# Patient Record
Sex: Male | Born: 1970 | Race: White | Hispanic: No | Marital: Married | Smoking: Never smoker
Health system: Southern US, Community
[De-identification: ages and names within clinical notes are randomized; demographics above are authoritative.]

## PROBLEM LIST (undated history)

## (undated) DIAGNOSIS — E039 Hypothyroidism, unspecified: Secondary | ICD-10-CM

## (undated) DIAGNOSIS — Z87442 Personal history of urinary calculi: Secondary | ICD-10-CM

## (undated) DIAGNOSIS — N200 Calculus of kidney: Secondary | ICD-10-CM

## (undated) DIAGNOSIS — E786 Lipoprotein deficiency: Secondary | ICD-10-CM

## (undated) DIAGNOSIS — E079 Disorder of thyroid, unspecified: Secondary | ICD-10-CM

## (undated) DIAGNOSIS — B354 Tinea corporis: Secondary | ICD-10-CM

## (undated) DIAGNOSIS — E119 Type 2 diabetes mellitus without complications: Secondary | ICD-10-CM

## (undated) DIAGNOSIS — Z8619 Personal history of other infectious and parasitic diseases: Secondary | ICD-10-CM

## (undated) HISTORY — DX: Tinea corporis: B35.4

## (undated) HISTORY — DX: Type 2 diabetes mellitus without complications: E11.9

## (undated) HISTORY — DX: Hypothyroidism, unspecified: E03.9

## (undated) HISTORY — DX: Disorder of thyroid, unspecified: E07.9

## (undated) HISTORY — DX: Lipoprotein deficiency: E78.6

---

## 2003-02-20 HISTORY — PX: INGUINAL HERNIA REPAIR: SUR1180

## 2004-01-05 ENCOUNTER — Ambulatory Visit: Payer: Self-pay | Admitting: General Surgery

## 2013-08-25 DIAGNOSIS — E039 Hypothyroidism, unspecified: Secondary | ICD-10-CM | POA: Insufficient documentation

## 2013-08-25 DIAGNOSIS — E119 Type 2 diabetes mellitus without complications: Secondary | ICD-10-CM | POA: Insufficient documentation

## 2013-09-08 DIAGNOSIS — Z794 Long term (current) use of insulin: Secondary | ICD-10-CM | POA: Insufficient documentation

## 2013-09-10 DIAGNOSIS — E109 Type 1 diabetes mellitus without complications: Secondary | ICD-10-CM | POA: Insufficient documentation

## 2014-04-20 LAB — HM DIABETES EYE EXAM

## 2016-08-27 LAB — LIPID PANEL
Cholesterol: 163 (ref 0–200)
HDL: 61 (ref 35–70)
LDL Cholesterol: 88
Triglycerides: 68 (ref 40–160)

## 2016-08-27 LAB — HEMOGLOBIN A1C: Hemoglobin A1C: 5.9

## 2016-08-27 LAB — BASIC METABOLIC PANEL: Creatinine: 0.7 (ref 0.6–1.3)

## 2016-12-28 LAB — TSH: TSH: 2.11 (ref 0.41–5.90)

## 2017-04-22 LAB — TSH: TSH: 4.24 (ref 0.41–5.90)

## 2017-04-22 LAB — HEMOGLOBIN A1C: Hemoglobin A1C: 6.6

## 2017-09-16 LAB — PSA: PSA: 0.9

## 2017-09-16 LAB — LIPID PANEL
Cholesterol: 154 (ref 0–200)
HDL: 55 (ref 35–70)
LDL Cholesterol: 88
Triglycerides: 56 (ref 40–160)

## 2017-09-16 LAB — HEMOGLOBIN A1C: Hemoglobin A1C: 6.1

## 2017-10-15 ENCOUNTER — Encounter: Payer: Self-pay | Admitting: Dietician

## 2017-10-15 ENCOUNTER — Encounter: Payer: BLUE CROSS/BLUE SHIELD | Attending: Family Medicine | Admitting: Dietician

## 2017-10-15 VITALS — BP 124/86 | Ht 74.0 in | Wt 174.2 lb

## 2017-10-15 DIAGNOSIS — Z713 Dietary counseling and surveillance: Secondary | ICD-10-CM | POA: Diagnosis not present

## 2017-10-15 DIAGNOSIS — E119 Type 2 diabetes mellitus without complications: Secondary | ICD-10-CM | POA: Insufficient documentation

## 2017-10-15 DIAGNOSIS — E109 Type 1 diabetes mellitus without complications: Secondary | ICD-10-CM

## 2017-10-15 NOTE — Progress Notes (Signed)
Diabetes Self-Management Education  Visit Type: First/Initial  Appt. Start Time: 1325 Appt. End Time: 1430  10/15/2017  Mr. Paul BeachBrian Jacobs, identified by name and date of birth, is a 47 y.o. male with a diagnosis of Diabetes: Type 1.   ASSESSMENT  Blood pressure 124/86, height 6\' 2"  (1.88 m), weight 174 lb 3.2 oz (79 kg). Body mass index is 22.37 kg/m.  Diabetes Self-Management Education - 10/15/17 1510      Visit Information   Visit Type  First/Initial      Initial Visit   Diabetes Type  Type 1      Health Coping   How would you rate your overall health?  Good      Psychosocial Assessment   Patient Belief/Attitude about Diabetes  Motivated to manage diabetes    Self-care barriers  None    Self-management support  Family;Doctor's office    Other persons present  Patient    Patient Concerns  Healthy Lifestyle;Glycemic Control   prevent complications, become more fit   Special Needs  None    Preferred Learning Style  Auditory;Visual;Hands on    Learning Readiness  Ready    What is the last grade level you completed in school?  1 hear college      Pre-Education Assessment   Patient understands the diabetes disease and treatment process.  Demonstrates understanding / competency    Patient understands incorporating nutritional management into lifestyle.  Demonstrates understanding / competency    Patient undertands incorporating physical activity into lifestyle.  Demonstrates understanding / competency    Patient understands using medications safely.  Demonstrates understanding / competency    Patient understands monitoring blood glucose, interpreting and using results  Demonstrates understanding / competency   uses Libre & Contour Next EZ meter; pt is frustrated with difference in [place] and meter BG results    Patient understands prevention, detection, and treatment of acute complications.  Demonstrates understanding / competency    Patient understands prevention, detection,  and treatment of chronic complications.  Demonstrates understanding / competency    Patient understands how to develop strategies to address psychosocial issues.  Demonstrates understanding / competency    Patient understands how to develop strategies to promote health/change behavior.  Demonstrates understanding / competency      Complications   Last HgB A1C per patient/outside source  6.1 %    How often do you check your blood sugar?  > 4 times/day    Fasting Blood glucose range (mg/dL)  16-109;[id number];130-179    Postprandial Blood glucose range (mg/dL)  981-191;[id number];70-129    Have you had a dilated eye exam in the past 12 months?  Yes    Have you had a dental exam in the past 12 months?  Yes    Are you checking your feet?  Yes   has toenail fungus right big toe   How many days per week are you checking your feet?  7      Dietary Intake   Breakfast  eats 45-60 gram carbs for breakfast at 8a-eats out 2-3x/wk   Snack (morning)  none    Lunch  eats 30-45 grams carbs for lunch at 12p-eats fried foods daily, eats snack foods and sweets 2-3x/wk-eats out 1x/wk   Snack (afternoon)  none    Dinner  eats supper at 7:30p-usually eats meat and vegetables-eats out 1-2x/wk   Snack (evening)  none    Beverage(s)  drinks water 2-3x/day, unsweetened tea 4-5x/day      Exercise  Exercise Type  Light (walking / raking leaves)    How many days per week to you exercise?  3.5    How many minutes per day do you exercise?  35    Total minutes per week of exercise  122.5      Patient Education   Previous Diabetes Education  Yes (please comment)    Nutrition management   Role of diet in the treatment of diabetes and the relationship between the three main macronutrients and blood glucose level    Physical activity and exercise   Role of exercise on diabetes management, blood pressure control and cardiac health.    Medications  Taught/reviewed insulin injection, site rotation, insulin storage and needle  disposal.;Reviewed patients medication for diabetes, action, purpose, timing of dose and side effects.    Monitoring  Purpose and frequency of SMBG.;Yearly dilated eye exam;Identified appropriate SMBG and/or A1C goals.;Taught/discussed recording of test results and interpretation of SMBG.   discussed difference in how BG meter and Libre sensor measure sugar    Acute complications  Taught treatment of hypoglycemia - the 15 rule.;Trained/discussed glucagon administration to patient and designated other.    Chronic complications  Relationship between chronic complications and blood glucose control;Retinopathy and reason for yearly dilated eye exams;Dental care;Nephropathy, what it is, prevention of, the use of ACE, ARB's and early detection of through urine microalbumia.;Lipid levels, blood glucose control and heart disease;Reviewed with patient heart disease, higher risk of, and prevention    Psychosocial adjustment  Role of stress on diabetes      Post-Education Assessment   Patient understands the diabetes disease and treatment process.  Demonstrates understanding / competency    Patient understands incorporating nutritional management into lifestyle.  Demonstrates understanding / competency    Patient undertands incorporating physical activity into lifestyle.  Demonstrates understanding / competency    Patient understands using medications safely.  Demonstrates understanding / competency    Patient understands monitoring blood glucose, interpreting and using results  Demonstrates understanding / competency    Patient understands prevention, detection, and treatment of acute complications.  Demonstrates understanding / competency    Patient understands prevention, detection, and treatment of chronic complications.  Demonstrates understanding / competency    Patient understands how to develop strategies to address psychosocial issues.  Demonstrates understanding / competency    Patient understands how  to develop strategies to promote health/change behavior.  Demonstrates understanding / competency      Outcomes   Expected Outcomes  Demonstrated interest in learning. Expect positive outcomes    Program Status  Completed       Individualized Plan for Diabetes Self-Management Training:   Learning Objective:  Patient will have a greater understanding of diabetes self-management. Patient education plan is to attend individual and/or group sessions per assessed needs and concerns.    Patient Instructions   Continue to check blood sugars at least  before  meals and before bed daily and PRN Exercise: continue walking 30-45 min. 4-5x/wk.  Carry fast acting glucose and a snack at all times Rotate injection sites Drink plenty of water Limit intake of fried foods and sweets Call if any questions or problems arise  Expected Outcomes:  Demonstrated interest in learning. Expect positive outcomes  Education material provided: A1C handout  If problems or questions, patient to contact team via:  [phone]  Future DSME appointment:   call if needed

## 2017-10-15 NOTE — Patient Instructions (Addendum)
  Continue to check blood sugars at least  before  meals and before bed daily and PRN Exercise: continue walking 30-45 min. 4-5x/wk.  Carry fast acting glucose and a snack at all times Rotate injection sites Drink plentyy of water  Limit intake of sweets and fried foods Call if any questions or problems arise

## 2017-11-01 ENCOUNTER — Ambulatory Visit: Payer: BLUE CROSS/BLUE SHIELD | Attending: Otolaryngology

## 2017-11-01 DIAGNOSIS — R0683 Snoring: Secondary | ICD-10-CM | POA: Diagnosis not present

## 2017-11-01 DIAGNOSIS — E119 Type 2 diabetes mellitus without complications: Secondary | ICD-10-CM | POA: Diagnosis not present

## 2017-11-01 DIAGNOSIS — G4733 Obstructive sleep apnea (adult) (pediatric): Secondary | ICD-10-CM | POA: Diagnosis present

## 2017-12-31 ENCOUNTER — Other Ambulatory Visit: Payer: Self-pay | Admitting: Physician Assistant

## 2017-12-31 DIAGNOSIS — M5116 Intervertebral disc disorders with radiculopathy, lumbar region: Secondary | ICD-10-CM

## 2018-04-28 ENCOUNTER — Other Ambulatory Visit: Payer: Self-pay

## 2018-04-28 LAB — BASIC METABOLIC PANEL: Creatinine: 0.9 (ref 0.6–1.3)

## 2018-04-28 LAB — HEMOGLOBIN A1C: Hemoglobin A1C: 6.7

## 2018-04-29 LAB — CREATININE, SERUM
Creatinine, Ser: 0.88 mg/dL (ref 0.76–1.27)
GFR calc Af Amer: 117 mL/min/{1.73_m2} (ref >59)
GFR calc non Af Amer: 101 mL/min/{1.73_m2} (ref >59)

## 2018-04-29 LAB — HGB A1C W/O EAG: HEMOGLOBIN A1C: 6.7 % — AB (ref 4.8–5.6)

## 2018-08-28 ENCOUNTER — Other Ambulatory Visit: Payer: Self-pay

## 2018-08-28 ENCOUNTER — Ambulatory Visit: Payer: 59

## 2018-08-28 DIAGNOSIS — Z0189 Encounter for other specified special examinations: Secondary | ICD-10-CM

## 2018-08-28 LAB — POCT URINALYSIS DIPSTICK
Bilirubin, UA: NEGATIVE
Glucose, UA: NEGATIVE
Ketones, UA: NEGATIVE
Leukocytes, UA: NEGATIVE
Nitrite, UA: NEGATIVE
Protein, UA: NEGATIVE
Spec Grav, UA: 1.01 (ref 1.010–1.025)
Urobilinogen, UA: 0.2 E.U./dL
pH, UA: 6 (ref 5.0–8.0)

## 2018-08-29 LAB — CMP12+LP+TP+TSH+6AC+PSA+CBC…
ALT: 19 IU/L (ref 0–44)
AST: 17 IU/L (ref 0–40)
Albumin/Globulin Ratio: 2.3 — ABNORMAL HIGH (ref 1.2–2.2)
Albumin: 4.4 g/dL (ref 4.0–5.0)
BUN/Creatinine Ratio: 27 — ABNORMAL HIGH (ref 9–20)
BUN: 22 mg/dL (ref 6–24)
Basophils Absolute: 0 10*3/uL (ref 0.0–0.2)
Basos: 1 %
Bilirubin Total: 0.4 mg/dL (ref 0.0–1.2)
Calcium: 9.6 mg/dL (ref 8.7–10.2)
Chloride: 104 mmol/L (ref 96–106)
Chol/HDL Ratio: 2.4 ratio (ref 0.0–5.0)
Cholesterol, Total: 175 mg/dL (ref 100–199)
Creatinine, Ser: 0.83 mg/dL (ref 0.76–1.27)
EOS (ABSOLUTE): 0.2 10*3/uL (ref 0.0–0.4)
Eos: 3 %
Estimated CHD Risk: <0.5 times avg. (ref 0.0–1.0)
Free Thyroxine Index: 2 (ref 1.2–4.9)
GFR calc Af Amer: 119 mL/min/{1.73_m2} (ref >59)
GFR calc non Af Amer: 103 mL/min/{1.73_m2} (ref >59)
GGT: 7 IU/L (ref 0–65)
Globulin, Total: 1.9 g/dL (ref 1.5–4.5)
Glucose: 153 mg/dL — ABNORMAL HIGH (ref 65–99)
HDL: 74 mg/dL (ref >39)
Hematocrit: 42.8 % (ref 37.5–51.0)
Hemoglobin: 15 g/dL (ref 13.0–17.7)
Immature Granulocytes: 0 %
Iron: 94 ug/dL (ref 38–169)
LDH: 137 IU/L (ref 121–224)
LDL Calculated: 90 mg/dL (ref 0–99)
Lymphocytes Absolute: 1.5 10*3/uL (ref 0.7–3.1)
Lymphs: 29 %
MCH: 30.2 pg (ref 26.6–33.0)
MCHC: 35 g/dL (ref 31.5–35.7)
MCV: 86 fL (ref 79–97)
Monocytes Absolute: 0.4 10*3/uL (ref 0.1–0.9)
Monocytes: 8 %
Neutrophils Absolute: 3.1 10*3/uL (ref 1.4–7.0)
Neutrophils: 59 %
Phosphorus: 3.5 mg/dL (ref 2.8–4.1)
Platelets: 296 10*3/uL (ref 150–450)
Potassium: 4.7 mmol/L (ref 3.5–5.2)
Prostate Specific Ag, Serum: 0.6 ng/mL (ref 0.0–4.0)
RBC: 4.97 x10E6/uL (ref 4.14–5.80)
RDW: 12.4 % (ref 11.6–15.4)
Sodium: 139 mmol/L (ref 134–144)
T3 Uptake Ratio: 23 % — ABNORMAL LOW (ref 24–39)
T4, Total: 8.7 ug/dL (ref 4.5–12.0)
TSH: 4.1 u[IU]/mL (ref 0.450–4.500)
Total Protein: 6.3 g/dL (ref 6.0–8.5)
Triglycerides: 55 mg/dL (ref 0–149)
Uric Acid: 4.2 mg/dL (ref 3.7–8.6)
VLDL Cholesterol Cal: 11 mg/dL (ref 5–40)
WBC: 5.2 10*3/uL (ref 3.4–10.8)

## 2018-08-29 LAB — CMP12+LP+TP+TSH+6AC+PSA+CBC?
Alkaline Phosphatase: 48 IU/L (ref 39–117)
Immature Grans (Abs): 0 10*3/uL (ref 0.0–0.1)

## 2018-08-29 LAB — HGB A1C W/O EAG: Hgb A1c MFr Bld: 6.2 % — ABNORMAL HIGH (ref 4.8–5.6)

## 2018-08-29 LAB — MICROALBUMIN / CREATININE URINE RATIO
Creatinine, Urine: 32.2 mg/dL
Microalb/Creat Ratio: <9 mg/g creat (ref 0–29)
Microalbumin, Urine: <3 ug/mL

## 2018-09-01 LAB — MICROALBUMIN / CREATININE URINE RATIO
Albumin ELP, Urine: 3.6
Albumin ELP, Urine: <3
Albumin Ratio: 2.2
Albumin Ratio: <2.5
Creatinine, Ur: 166.4 mg/dL
Creatinine, Ur: 121.9 mg/dL

## 2018-09-01 LAB — VITAMIN B12 DEFICIENCY PANEL
Folate: 10.4
VITAMIN B12: 645

## 2018-09-01 LAB — HEPATITIS B SURFACE ANTIBODY,QUALITATIVE: Hep B S Ab: >150

## 2018-09-01 LAB — TESTOSTERONE
Testosterone: 824
Vitamin D, 25-Hydroxy: 57.4

## 2018-09-02 ENCOUNTER — Encounter: Payer: Self-pay | Admitting: Internal Medicine

## 2018-09-02 ENCOUNTER — Ambulatory Visit: Payer: 59 | Admitting: Internal Medicine

## 2018-09-02 ENCOUNTER — Other Ambulatory Visit: Payer: Self-pay

## 2018-09-02 VITALS — BP 121/81 | HR 64 | Temp 96.7°F | Resp 14 | Ht 74.0 in | Wt 177.0 lb

## 2018-09-02 DIAGNOSIS — E7849 Other hyperlipidemia: Secondary | ICD-10-CM

## 2018-09-02 DIAGNOSIS — E119 Type 2 diabetes mellitus without complications: Secondary | ICD-10-CM

## 2018-09-02 DIAGNOSIS — B351 Tinea unguium: Secondary | ICD-10-CM | POA: Insufficient documentation

## 2018-09-02 DIAGNOSIS — E038 Other specified hypothyroidism: Secondary | ICD-10-CM

## 2018-09-02 NOTE — Progress Notes (Signed)
S  - 48 y.o. male IDDM who presents for annual physical evaluation  Has been feeling well, notes weather has been hot Takes 3-4 U at breakfast, 3-5 at lunch, 6-7 at dinner of Novalog and 14U Lantus at bedtime. Also on Metformin still. Has a freestyle Libre and checks sugars regularly, occas in 50's/60's, not often, and rare above 180.  No specific complaints, denies any recent CP, palpitations, SOB, abdominal pains, change in bowel habits, dark/black stools, vision changes (saw eye MD a month ago), no recent fevers or concrning Covid sx's, up rarely once a night to urinate, no urgency, toenail fungus persists on right, and now some on left, was on oral med in past for this and never went away completely. No fatigue.   Exercise - no regular exercise, tries to walk often but less recent past Diet - notes tries to watch and eat healthy,   Meds reviewed - also takes a baby ASA/day Current Outpatient Medications on File Prior to Visit  Medication Sig Dispense Refill  . glucagon 1 MG injection Inject 1 mg into the muscle as needed.    Marland Kitchen. glucose blood test strip TEST 4 TIMES DAILY    . insulin aspart (NOVOLOG) 100 UNIT/ML FlexPen Inject 5-7 Units into the skin 3 (three) times daily before meals. Uses ICR 1:15 + sliding scale if >180    . Insulin Glargine (LANTUS SOLOSTAR) 100 UNIT/ML Solostar Pen Inject 14 Units into the skin at bedtime.    Marland Kitchen. levothyroxine (SYNTHROID, LEVOTHROID) 50 MCG tablet Take 1 tablet by mouth daily.    . meloxicam (MOBIC) 15 MG tablet Take 1 tablet by mouth daily as needed.  0  . metFORMIN (GLUCOPHAGE) 1000 MG tablet Take 1 tablet by mouth 2 (two) times daily.    . Omega-3 Fatty Acids (FISH OIL PO) Take 1 capsule by mouth daily.     No current facility-administered medications on file prior to visit.     No Known Allergies   Social History   Tobacco Use  Smoking Status Never Smoker  Smokeless Tobacco Current User  . Types: Chew    Uses a can a day  FH - F with  DM  O - NAD, masked  BP 121/81 (BP Location: Right Arm, Patient Position: Sitting, Cuff Size: Large)   Pulse 64   Temp (!) 96.7 F (35.9 C) (Oral)   Resp 14   Ht 6\' 2"  (1.88 m)   Wt 177 lb (80.3 kg)   SpO2 97%   BMI 22.73 kg/m   Ht 6\' 2"  (1.88 m)   Wt 177 lb (80.3 kg)   BMI 22.73 kg/m    HEENT - sclera anicteric, PERRL, EOMI, conj - non-inj'ed, nares patent, TM's and canals clear Neck - supple, no adenopathy, no TM, carotds 2+ and = , no bruits Car - RRR without m/g/r Pulm- CTA without wheeze or rales Abd - soft, NT, ND, BS+, no obvious HSM, no masses Back - no CVA tenderness Skin- no new lesions of concern on exposed areas, denied otherwise Ext - no LE edema, no active joints, + toenail fungus on right great toe, lesser involvement of left great toenail, feet without lesions of concern GU - no swelling in inguinal/suprapubic region, NT, testicle and prostate exams deferred (without concerning sx's and after discussion on current recommendations for prostate CA screening including PSA tests) Neuro - affect was not flat, appropriate with conversation  Grossly non-focal with good strength on testing, sensation intact to LT in  distal extremities, DTR's 2+ and = patella, Romberg neg, no pronator drift, good balance on one foot, good finger to nose  Monofilament testing neg bilat feet  Labs reviewed - A1C - 6.2, (6.7 last check), LDL - 90, TC - 175, PSA-0.6, urine for microalb - neg ECG reviewed - no concerning changes from prior ECG Colonoscopy screening discussed and reviewed will be rec'ed when turns 48 in October and he was aware Hearing screen - slight decrease at higher frequencies and reviewed with him  Ass/Plan - 1. DM, on insulin presently to control - controlled, A1C - 6.2, saw endocrine in past and then the provider retired and followed here as cost a big issue as well noted  Continue current medications Reviewed the need for a statin with goal to keep LDL <70, he  wanted to try without another med presently Importance of diet modifications emphasized Importance of some regular aerobic exercise encouraged Above to help with weight control/weight loss also important, and has been good to date Continue to monitor blood sugars as doing Discussed why important to keep sugars very well controlled to help protect the heart, kidneys, and lessen future complications. Needs routine eye examinations and recommended following up for regular assessments and importance, last saw one month ago  2. Hypothyroid - on levothyroxine and TSH ok (4.1)  Cont the thyroid supp Recheck TSH yearly at a minimum  3. Hyperlipidemia -  slightly increased LDL with goal as above (<70), statin rec'ed and holding on that presently as noted above  4. Soon to turn 35 with colonoscopy for screening rec'ed then (October)  5. Onychomycosis - discussed at length the oral meds to treat and limited success and risk/benefit concerns with these meds noted  He will use powders liberally rec'ed to try to prevent more spread, will not treat current toenail fungus If he desires to try the meds again, he will let me know as discussed  F/u again in 3 months, sooner prn

## 2018-09-09 DIAGNOSIS — M5416 Radiculopathy, lumbar region: Secondary | ICD-10-CM | POA: Diagnosis not present

## 2018-09-09 DIAGNOSIS — M5136 Other intervertebral disc degeneration, lumbar region: Secondary | ICD-10-CM | POA: Diagnosis not present

## 2018-09-09 DIAGNOSIS — M5431 Sciatica, right side: Secondary | ICD-10-CM | POA: Diagnosis not present

## 2018-09-09 DIAGNOSIS — M9903 Segmental and somatic dysfunction of lumbar region: Secondary | ICD-10-CM | POA: Diagnosis not present

## 2018-09-30 ENCOUNTER — Other Ambulatory Visit: Payer: Self-pay

## 2018-09-30 MED ORDER — LEVEMIR FLEXTOUCH 100 UNIT/ML ~~LOC~~ SOPN: 15.0000 [IU] | PEN_INJECTOR | Freq: Every day | SUBCUTANEOUS | 1 refills | Status: DC

## 2018-09-30 NOTE — Telephone Encounter (Signed)
Insurance doesn't cover lantus they cover levemir instead

## 2018-10-07 ENCOUNTER — Telehealth: Payer: Self-pay

## 2018-10-07 MED ORDER — BASAGLAR KWIKPEN 100 UNIT/ML ~~LOC~~ SOPN: 15.0000 [IU] | PEN_INJECTOR | Freq: Every day | SUBCUTANEOUS | 0 refills | Status: DC

## 2018-10-07 NOTE — Telephone Encounter (Signed)
Yes that would be ok. Thanks. 

## 2018-10-07 NOTE — Telephone Encounter (Signed)
Called pharm and they ran a dummy rx for [employer] and on insurance it will $35 for 4 pens, which is 4 pens and will last 80 days which is max for insurance.  Went ahead and called rx into pharm and pt aware. Called and left message for pt to make him aware

## 2018-10-07 NOTE — Telephone Encounter (Signed)
Pt was on Lantus prior to insurance change.  Pt states that Levemir is costing $135 for 3 months with a discount card.   Pharmacy states that Nancee Liter is covered but unsure of cost.   If ok I can call rx in to pharmacy for med so they can check cost.

## 2018-10-13 DIAGNOSIS — M9903 Segmental and somatic dysfunction of lumbar region: Secondary | ICD-10-CM | POA: Diagnosis not present

## 2018-10-13 DIAGNOSIS — M5431 Sciatica, right side: Secondary | ICD-10-CM | POA: Diagnosis not present

## 2018-10-13 DIAGNOSIS — M5136 Other intervertebral disc degeneration, lumbar region: Secondary | ICD-10-CM | POA: Diagnosis not present

## 2018-10-13 DIAGNOSIS — M5416 Radiculopathy, lumbar region: Secondary | ICD-10-CM | POA: Diagnosis not present

## 2018-10-21 ENCOUNTER — Other Ambulatory Visit: Payer: Self-pay

## 2018-10-21 MED ORDER — LEVOTHYROXINE SODIUM 50 MCG PO TABS: 50.0000 ug | ORAL_TABLET | Freq: Every day | ORAL | 3 refills | Status: DC

## 2018-11-03 ENCOUNTER — Telehealth: Payer: Self-pay

## 2018-11-03 DIAGNOSIS — M542 Cervicalgia: Secondary | ICD-10-CM | POA: Diagnosis not present

## 2018-11-03 DIAGNOSIS — S29012A Strain of muscle and tendon of back wall of thorax, initial encounter: Secondary | ICD-10-CM | POA: Diagnosis not present

## 2018-11-03 DIAGNOSIS — S46812A Strain of other muscles, fascia and tendons at shoulder and upper arm level, left arm, initial encounter: Secondary | ICD-10-CM | POA: Insufficient documentation

## 2018-11-03 NOTE — Telephone Encounter (Signed)
Called to report that his "neck's been bothering him for several weeks".  Went to [facility] today & seen & treated by Carlynn Spry.  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  States he was prescribed two medications - prednisone & a muscle relaxer.  Not sure of the name of the muscle relaxer because Rx sent to pharmacy.  Will access Mychart & send [place] the names of the medications after he picks them up.  AMD

## 2018-11-04 ENCOUNTER — Telehealth: Payer: Self-pay

## 2018-11-04 ENCOUNTER — Encounter: Payer: Self-pay | Admitting: Internal Medicine

## 2018-11-04 NOTE — Telephone Encounter (Signed)
Patient to send information for medication refills through [place].  MyChart activation code sent to patient.

## 2018-11-05 NOTE — Telephone Encounter (Signed)
Thank you Ann!

## 2018-11-05 NOTE — Telephone Encounter (Signed)
Spoke with Paul Jacobs.  Said he sent a message about his visit to [facility] & sent [place] the medications he was prescribed by Carlynn Spry.  At this time he doesn't need any of his regular medications refilled.  AMD

## 2018-11-05 NOTE — Telephone Encounter (Signed)
Have not received a refill request from this patient. Could you please call and address what medications he needs to be refilled. Thanks,

## 2018-11-10 DIAGNOSIS — M5412 Radiculopathy, cervical region: Secondary | ICD-10-CM | POA: Diagnosis not present

## 2018-11-10 DIAGNOSIS — M9902 Segmental and somatic dysfunction of thoracic region: Secondary | ICD-10-CM | POA: Diagnosis not present

## 2018-11-10 DIAGNOSIS — M9901 Segmental and somatic dysfunction of cervical region: Secondary | ICD-10-CM | POA: Diagnosis not present

## 2018-11-10 DIAGNOSIS — M9903 Segmental and somatic dysfunction of lumbar region: Secondary | ICD-10-CM | POA: Diagnosis not present

## 2018-11-11 ENCOUNTER — Other Ambulatory Visit: Payer: Self-pay

## 2018-11-11 MED ORDER — INSULIN ASPART 100 UNIT/ML FLEXPEN: 5.0000 [IU] | PEN_INJECTOR | Freq: Three times a day (TID) | SUBCUTANEOUS | 1 refills | Status: DC

## 2018-11-12 DIAGNOSIS — M9903 Segmental and somatic dysfunction of lumbar region: Secondary | ICD-10-CM | POA: Diagnosis not present

## 2018-11-12 DIAGNOSIS — M542 Cervicalgia: Secondary | ICD-10-CM | POA: Diagnosis not present

## 2018-11-12 DIAGNOSIS — M25512 Pain in left shoulder: Secondary | ICD-10-CM | POA: Diagnosis not present

## 2018-11-12 DIAGNOSIS — M9901 Segmental and somatic dysfunction of cervical region: Secondary | ICD-10-CM | POA: Diagnosis not present

## 2018-11-12 DIAGNOSIS — M9902 Segmental and somatic dysfunction of thoracic region: Secondary | ICD-10-CM | POA: Diagnosis not present

## 2018-11-12 DIAGNOSIS — M5412 Radiculopathy, cervical region: Secondary | ICD-10-CM | POA: Diagnosis not present

## 2018-11-13 DIAGNOSIS — M5412 Radiculopathy, cervical region: Secondary | ICD-10-CM | POA: Diagnosis not present

## 2018-11-13 DIAGNOSIS — M9902 Segmental and somatic dysfunction of thoracic region: Secondary | ICD-10-CM | POA: Diagnosis not present

## 2018-11-13 DIAGNOSIS — M9903 Segmental and somatic dysfunction of lumbar region: Secondary | ICD-10-CM | POA: Diagnosis not present

## 2018-11-13 DIAGNOSIS — M9901 Segmental and somatic dysfunction of cervical region: Secondary | ICD-10-CM | POA: Diagnosis not present

## 2018-11-17 DIAGNOSIS — M25512 Pain in left shoulder: Secondary | ICD-10-CM | POA: Diagnosis not present

## 2018-11-17 DIAGNOSIS — M9901 Segmental and somatic dysfunction of cervical region: Secondary | ICD-10-CM | POA: Diagnosis not present

## 2018-11-17 DIAGNOSIS — M542 Cervicalgia: Secondary | ICD-10-CM | POA: Diagnosis not present

## 2018-11-17 DIAGNOSIS — M9902 Segmental and somatic dysfunction of thoracic region: Secondary | ICD-10-CM | POA: Diagnosis not present

## 2018-11-17 DIAGNOSIS — M9903 Segmental and somatic dysfunction of lumbar region: Secondary | ICD-10-CM | POA: Diagnosis not present

## 2018-11-17 DIAGNOSIS — M5412 Radiculopathy, cervical region: Secondary | ICD-10-CM | POA: Diagnosis not present

## 2018-11-19 DIAGNOSIS — M9903 Segmental and somatic dysfunction of lumbar region: Secondary | ICD-10-CM | POA: Diagnosis not present

## 2018-11-19 DIAGNOSIS — M9902 Segmental and somatic dysfunction of thoracic region: Secondary | ICD-10-CM | POA: Diagnosis not present

## 2018-11-19 DIAGNOSIS — M5412 Radiculopathy, cervical region: Secondary | ICD-10-CM | POA: Diagnosis not present

## 2018-11-19 DIAGNOSIS — M9901 Segmental and somatic dysfunction of cervical region: Secondary | ICD-10-CM | POA: Diagnosis not present

## 2018-11-20 ENCOUNTER — Ambulatory Visit: Payer: 59

## 2018-11-20 DIAGNOSIS — Z23 Encounter for immunization: Secondary | ICD-10-CM

## 2018-11-20 DIAGNOSIS — M542 Cervicalgia: Secondary | ICD-10-CM | POA: Diagnosis not present

## 2018-11-20 DIAGNOSIS — M25512 Pain in left shoulder: Secondary | ICD-10-CM | POA: Diagnosis not present

## 2018-11-21 DIAGNOSIS — M5412 Radiculopathy, cervical region: Secondary | ICD-10-CM | POA: Diagnosis not present

## 2018-11-21 DIAGNOSIS — M9901 Segmental and somatic dysfunction of cervical region: Secondary | ICD-10-CM | POA: Diagnosis not present

## 2018-11-21 DIAGNOSIS — M9903 Segmental and somatic dysfunction of lumbar region: Secondary | ICD-10-CM | POA: Diagnosis not present

## 2018-11-21 DIAGNOSIS — M9902 Segmental and somatic dysfunction of thoracic region: Secondary | ICD-10-CM | POA: Diagnosis not present

## 2018-11-24 DIAGNOSIS — M9902 Segmental and somatic dysfunction of thoracic region: Secondary | ICD-10-CM | POA: Diagnosis not present

## 2018-11-24 DIAGNOSIS — M9901 Segmental and somatic dysfunction of cervical region: Secondary | ICD-10-CM | POA: Diagnosis not present

## 2018-11-24 DIAGNOSIS — M9903 Segmental and somatic dysfunction of lumbar region: Secondary | ICD-10-CM | POA: Diagnosis not present

## 2018-11-24 DIAGNOSIS — M25512 Pain in left shoulder: Secondary | ICD-10-CM | POA: Diagnosis not present

## 2018-11-24 DIAGNOSIS — M5412 Radiculopathy, cervical region: Secondary | ICD-10-CM | POA: Diagnosis not present

## 2018-11-24 DIAGNOSIS — M542 Cervicalgia: Secondary | ICD-10-CM | POA: Diagnosis not present

## 2018-11-27 DIAGNOSIS — M9902 Segmental and somatic dysfunction of thoracic region: Secondary | ICD-10-CM | POA: Diagnosis not present

## 2018-11-27 DIAGNOSIS — M9901 Segmental and somatic dysfunction of cervical region: Secondary | ICD-10-CM | POA: Diagnosis not present

## 2018-11-27 DIAGNOSIS — M5412 Radiculopathy, cervical region: Secondary | ICD-10-CM | POA: Diagnosis not present

## 2018-11-27 DIAGNOSIS — M542 Cervicalgia: Secondary | ICD-10-CM | POA: Diagnosis not present

## 2018-11-27 DIAGNOSIS — M9903 Segmental and somatic dysfunction of lumbar region: Secondary | ICD-10-CM | POA: Diagnosis not present

## 2018-11-27 DIAGNOSIS — M25512 Pain in left shoulder: Secondary | ICD-10-CM | POA: Diagnosis not present

## 2018-12-01 DIAGNOSIS — S29012A Strain of muscle and tendon of back wall of thorax, initial encounter: Secondary | ICD-10-CM | POA: Diagnosis not present

## 2018-12-01 DIAGNOSIS — M9903 Segmental and somatic dysfunction of lumbar region: Secondary | ICD-10-CM | POA: Diagnosis not present

## 2018-12-01 DIAGNOSIS — M25512 Pain in left shoulder: Secondary | ICD-10-CM | POA: Diagnosis not present

## 2018-12-01 DIAGNOSIS — M5412 Radiculopathy, cervical region: Secondary | ICD-10-CM | POA: Diagnosis not present

## 2018-12-01 DIAGNOSIS — M9902 Segmental and somatic dysfunction of thoracic region: Secondary | ICD-10-CM | POA: Diagnosis not present

## 2018-12-01 DIAGNOSIS — M542 Cervicalgia: Secondary | ICD-10-CM | POA: Diagnosis not present

## 2018-12-01 DIAGNOSIS — M9901 Segmental and somatic dysfunction of cervical region: Secondary | ICD-10-CM | POA: Diagnosis not present

## 2018-12-04 ENCOUNTER — Ambulatory Visit: Payer: 59

## 2018-12-05 DIAGNOSIS — M9901 Segmental and somatic dysfunction of cervical region: Secondary | ICD-10-CM | POA: Diagnosis not present

## 2018-12-05 DIAGNOSIS — M5412 Radiculopathy, cervical region: Secondary | ICD-10-CM | POA: Diagnosis not present

## 2018-12-05 DIAGNOSIS — M9903 Segmental and somatic dysfunction of lumbar region: Secondary | ICD-10-CM | POA: Diagnosis not present

## 2018-12-05 DIAGNOSIS — M9902 Segmental and somatic dysfunction of thoracic region: Secondary | ICD-10-CM | POA: Diagnosis not present

## 2018-12-08 ENCOUNTER — Other Ambulatory Visit: Payer: Self-pay

## 2018-12-08 ENCOUNTER — Ambulatory Visit: Payer: 59 | Admitting: Physician Assistant

## 2018-12-08 ENCOUNTER — Encounter: Payer: Self-pay | Admitting: Physician Assistant

## 2018-12-08 VITALS — BP 122/84 | HR 66 | Temp 97.8°F | Resp 12 | Ht 74.0 in | Wt 186.0 lb

## 2018-12-08 DIAGNOSIS — Z794 Long term (current) use of insulin: Secondary | ICD-10-CM

## 2018-12-08 DIAGNOSIS — M5412 Radiculopathy, cervical region: Secondary | ICD-10-CM | POA: Diagnosis not present

## 2018-12-08 DIAGNOSIS — E119 Type 2 diabetes mellitus without complications: Secondary | ICD-10-CM

## 2018-12-08 DIAGNOSIS — M9901 Segmental and somatic dysfunction of cervical region: Secondary | ICD-10-CM | POA: Diagnosis not present

## 2018-12-08 DIAGNOSIS — M9902 Segmental and somatic dysfunction of thoracic region: Secondary | ICD-10-CM | POA: Diagnosis not present

## 2018-12-08 DIAGNOSIS — M9903 Segmental and somatic dysfunction of lumbar region: Secondary | ICD-10-CM | POA: Diagnosis not present

## 2018-12-08 NOTE — Progress Notes (Signed)
Annual physical completed on 09/02/2018 with Dr. Roxan Hockey.  Today is 3 month DM Follow-up visit with A1c Now.  A1c Now = 5.8  AMD

## 2018-12-08 NOTE — Progress Notes (Signed)
   Subjective:Lab Results    Patient ID: Paul Jacobs, male    DOB: October 13, 1970, 48 y.o.   MRN: [id number]  HPI Patient present for 3 months HgA1c. Patient is Type 1 diabetic.   Review of Systems    Negitive except for compliant. Objective:   Physical Exam HEENt unremarkable Neck supple Lungs CTA Heart RRR       Assessment & Plan:  Insulin controlled diabetics. Follow up 6 months

## 2018-12-09 DIAGNOSIS — M542 Cervicalgia: Secondary | ICD-10-CM | POA: Diagnosis not present

## 2018-12-09 DIAGNOSIS — M25512 Pain in left shoulder: Secondary | ICD-10-CM | POA: Diagnosis not present

## 2018-12-10 DIAGNOSIS — M9902 Segmental and somatic dysfunction of thoracic region: Secondary | ICD-10-CM | POA: Diagnosis not present

## 2018-12-10 DIAGNOSIS — M5412 Radiculopathy, cervical region: Secondary | ICD-10-CM | POA: Diagnosis not present

## 2018-12-10 DIAGNOSIS — M9901 Segmental and somatic dysfunction of cervical region: Secondary | ICD-10-CM | POA: Diagnosis not present

## 2018-12-10 DIAGNOSIS — M9903 Segmental and somatic dysfunction of lumbar region: Secondary | ICD-10-CM | POA: Diagnosis not present

## 2018-12-17 DIAGNOSIS — M25512 Pain in left shoulder: Secondary | ICD-10-CM | POA: Diagnosis not present

## 2018-12-17 DIAGNOSIS — M5412 Radiculopathy, cervical region: Secondary | ICD-10-CM | POA: Diagnosis not present

## 2018-12-17 DIAGNOSIS — M9903 Segmental and somatic dysfunction of lumbar region: Secondary | ICD-10-CM | POA: Diagnosis not present

## 2018-12-17 DIAGNOSIS — M542 Cervicalgia: Secondary | ICD-10-CM | POA: Diagnosis not present

## 2018-12-17 DIAGNOSIS — M9902 Segmental and somatic dysfunction of thoracic region: Secondary | ICD-10-CM | POA: Diagnosis not present

## 2018-12-17 DIAGNOSIS — M9901 Segmental and somatic dysfunction of cervical region: Secondary | ICD-10-CM | POA: Diagnosis not present

## 2018-12-22 DIAGNOSIS — M5412 Radiculopathy, cervical region: Secondary | ICD-10-CM | POA: Diagnosis not present

## 2018-12-22 DIAGNOSIS — Z0189 Encounter for other specified special examinations: Secondary | ICD-10-CM | POA: Diagnosis not present

## 2018-12-22 DIAGNOSIS — M9902 Segmental and somatic dysfunction of thoracic region: Secondary | ICD-10-CM | POA: Diagnosis not present

## 2018-12-22 DIAGNOSIS — M9903 Segmental and somatic dysfunction of lumbar region: Secondary | ICD-10-CM | POA: Diagnosis not present

## 2018-12-22 DIAGNOSIS — M9901 Segmental and somatic dysfunction of cervical region: Secondary | ICD-10-CM | POA: Diagnosis not present

## 2018-12-25 DIAGNOSIS — M9903 Segmental and somatic dysfunction of lumbar region: Secondary | ICD-10-CM | POA: Diagnosis not present

## 2018-12-25 DIAGNOSIS — M5412 Radiculopathy, cervical region: Secondary | ICD-10-CM | POA: Diagnosis not present

## 2018-12-25 DIAGNOSIS — M542 Cervicalgia: Secondary | ICD-10-CM | POA: Diagnosis not present

## 2018-12-25 DIAGNOSIS — M9901 Segmental and somatic dysfunction of cervical region: Secondary | ICD-10-CM | POA: Diagnosis not present

## 2018-12-25 DIAGNOSIS — M9902 Segmental and somatic dysfunction of thoracic region: Secondary | ICD-10-CM | POA: Diagnosis not present

## 2018-12-29 DIAGNOSIS — M9901 Segmental and somatic dysfunction of cervical region: Secondary | ICD-10-CM | POA: Diagnosis not present

## 2018-12-29 DIAGNOSIS — M5412 Radiculopathy, cervical region: Secondary | ICD-10-CM | POA: Diagnosis not present

## 2018-12-29 DIAGNOSIS — M9903 Segmental and somatic dysfunction of lumbar region: Secondary | ICD-10-CM | POA: Diagnosis not present

## 2018-12-29 DIAGNOSIS — M9902 Segmental and somatic dysfunction of thoracic region: Secondary | ICD-10-CM | POA: Diagnosis not present

## 2019-01-01 DIAGNOSIS — M9903 Segmental and somatic dysfunction of lumbar region: Secondary | ICD-10-CM | POA: Diagnosis not present

## 2019-01-01 DIAGNOSIS — M9902 Segmental and somatic dysfunction of thoracic region: Secondary | ICD-10-CM | POA: Diagnosis not present

## 2019-01-01 DIAGNOSIS — M5412 Radiculopathy, cervical region: Secondary | ICD-10-CM | POA: Diagnosis not present

## 2019-01-01 DIAGNOSIS — M9901 Segmental and somatic dysfunction of cervical region: Secondary | ICD-10-CM | POA: Diagnosis not present

## 2019-01-05 DIAGNOSIS — M9902 Segmental and somatic dysfunction of thoracic region: Secondary | ICD-10-CM | POA: Diagnosis not present

## 2019-01-05 DIAGNOSIS — M5412 Radiculopathy, cervical region: Secondary | ICD-10-CM | POA: Diagnosis not present

## 2019-01-05 DIAGNOSIS — M9901 Segmental and somatic dysfunction of cervical region: Secondary | ICD-10-CM | POA: Diagnosis not present

## 2019-01-05 DIAGNOSIS — M9903 Segmental and somatic dysfunction of lumbar region: Secondary | ICD-10-CM | POA: Diagnosis not present

## 2019-01-07 DIAGNOSIS — M5412 Radiculopathy, cervical region: Secondary | ICD-10-CM | POA: Diagnosis not present

## 2019-01-07 DIAGNOSIS — M9901 Segmental and somatic dysfunction of cervical region: Secondary | ICD-10-CM | POA: Diagnosis not present

## 2019-01-07 DIAGNOSIS — M9902 Segmental and somatic dysfunction of thoracic region: Secondary | ICD-10-CM | POA: Diagnosis not present

## 2019-01-07 DIAGNOSIS — M9903 Segmental and somatic dysfunction of lumbar region: Secondary | ICD-10-CM | POA: Diagnosis not present

## 2019-01-08 DIAGNOSIS — M5412 Radiculopathy, cervical region: Secondary | ICD-10-CM | POA: Diagnosis not present

## 2019-01-10 DIAGNOSIS — M5412 Radiculopathy, cervical region: Secondary | ICD-10-CM | POA: Diagnosis not present

## 2019-01-10 DIAGNOSIS — M9902 Segmental and somatic dysfunction of thoracic region: Secondary | ICD-10-CM | POA: Diagnosis not present

## 2019-01-10 DIAGNOSIS — M9901 Segmental and somatic dysfunction of cervical region: Secondary | ICD-10-CM | POA: Diagnosis not present

## 2019-01-10 DIAGNOSIS — M9903 Segmental and somatic dysfunction of lumbar region: Secondary | ICD-10-CM | POA: Diagnosis not present

## 2019-01-19 DIAGNOSIS — M9901 Segmental and somatic dysfunction of cervical region: Secondary | ICD-10-CM | POA: Diagnosis not present

## 2019-01-19 DIAGNOSIS — M5412 Radiculopathy, cervical region: Secondary | ICD-10-CM | POA: Diagnosis not present

## 2019-01-19 DIAGNOSIS — M9902 Segmental and somatic dysfunction of thoracic region: Secondary | ICD-10-CM | POA: Diagnosis not present

## 2019-01-19 DIAGNOSIS — M9903 Segmental and somatic dysfunction of lumbar region: Secondary | ICD-10-CM | POA: Diagnosis not present

## 2019-01-29 DIAGNOSIS — M9903 Segmental and somatic dysfunction of lumbar region: Secondary | ICD-10-CM | POA: Diagnosis not present

## 2019-01-29 DIAGNOSIS — M9901 Segmental and somatic dysfunction of cervical region: Secondary | ICD-10-CM | POA: Diagnosis not present

## 2019-01-29 DIAGNOSIS — M5412 Radiculopathy, cervical region: Secondary | ICD-10-CM | POA: Diagnosis not present

## 2019-01-29 DIAGNOSIS — M9902 Segmental and somatic dysfunction of thoracic region: Secondary | ICD-10-CM | POA: Diagnosis not present

## 2019-02-09 DIAGNOSIS — M9902 Segmental and somatic dysfunction of thoracic region: Secondary | ICD-10-CM | POA: Diagnosis not present

## 2019-02-09 DIAGNOSIS — M9903 Segmental and somatic dysfunction of lumbar region: Secondary | ICD-10-CM | POA: Diagnosis not present

## 2019-02-09 DIAGNOSIS — M9901 Segmental and somatic dysfunction of cervical region: Secondary | ICD-10-CM | POA: Diagnosis not present

## 2019-02-09 DIAGNOSIS — M5412 Radiculopathy, cervical region: Secondary | ICD-10-CM | POA: Diagnosis not present

## 2019-02-23 DIAGNOSIS — M9901 Segmental and somatic dysfunction of cervical region: Secondary | ICD-10-CM | POA: Diagnosis not present

## 2019-02-23 DIAGNOSIS — M9903 Segmental and somatic dysfunction of lumbar region: Secondary | ICD-10-CM | POA: Diagnosis not present

## 2019-02-23 DIAGNOSIS — M5412 Radiculopathy, cervical region: Secondary | ICD-10-CM | POA: Diagnosis not present

## 2019-02-23 DIAGNOSIS — M9902 Segmental and somatic dysfunction of thoracic region: Secondary | ICD-10-CM | POA: Diagnosis not present

## 2019-03-04 ENCOUNTER — Telehealth: Payer: Self-pay | Admitting: Registered Nurse

## 2019-03-04 ENCOUNTER — Other Ambulatory Visit: Payer: Self-pay

## 2019-03-04 ENCOUNTER — Encounter: Payer: Self-pay | Admitting: Registered Nurse

## 2019-03-04 NOTE — Telephone Encounter (Signed)
Received fax from [facility] [address] [place] for refill BD nano 2 gen pen ndl 32gx34mm sig use with insulin pen QID #100.  Patient last seen in clinic by PA Smith on 12/08/2018 A1cnow 5.8.  Labs 08/28/2018 Hgba1c 6.2 renal and liver function stable  Patient due HgbA1c Jan 2021.  Please fax signed refills BD nano pen needles 32gx1mm use QID prn #100 RF6 back to [facility] on patient paper chart.

## 2019-03-04 NOTE — Telephone Encounter (Signed)
noted 

## 2019-03-04 NOTE — Telephone Encounter (Signed)
PT is scheduled for next week.

## 2019-03-10 ENCOUNTER — Other Ambulatory Visit: Payer: Self-pay

## 2019-03-10 ENCOUNTER — Other Ambulatory Visit: Payer: Self-pay | Admitting: Internal Medicine

## 2019-03-10 ENCOUNTER — Other Ambulatory Visit: Payer: 59

## 2019-03-10 DIAGNOSIS — M9902 Segmental and somatic dysfunction of thoracic region: Secondary | ICD-10-CM | POA: Diagnosis not present

## 2019-03-10 DIAGNOSIS — E109 Type 1 diabetes mellitus without complications: Secondary | ICD-10-CM

## 2019-03-10 DIAGNOSIS — E119 Type 2 diabetes mellitus without complications: Secondary | ICD-10-CM

## 2019-03-10 DIAGNOSIS — M9903 Segmental and somatic dysfunction of lumbar region: Secondary | ICD-10-CM | POA: Diagnosis not present

## 2019-03-10 DIAGNOSIS — Z794 Long term (current) use of insulin: Secondary | ICD-10-CM

## 2019-03-10 DIAGNOSIS — M9901 Segmental and somatic dysfunction of cervical region: Secondary | ICD-10-CM | POA: Diagnosis not present

## 2019-03-10 DIAGNOSIS — M5412 Radiculopathy, cervical region: Secondary | ICD-10-CM | POA: Diagnosis not present

## 2019-03-10 MED ORDER — BD PEN NEEDLE NANO 2ND GEN 32G X 4 MM MISC: 2 refills | Status: DC

## 2019-03-10 NOTE — Progress Notes (Signed)
Scheduled to review lab results 03/18/2019 with Durward Parcel, PA-C (Interim Provider).  AMD

## 2019-03-10 NOTE — Addendum Note (Signed)
Addended by: Gardner Candle on: 03/10/2019 09:16 AM   Modules accepted: Orders

## 2019-03-11 ENCOUNTER — Other Ambulatory Visit: Payer: Self-pay

## 2019-03-11 ENCOUNTER — Other Ambulatory Visit: Payer: Self-pay | Admitting: Physician Assistant

## 2019-03-11 LAB — HGB A1C W/O EAG: Hgb A1c MFr Bld: 6 % — ABNORMAL HIGH (ref 4.8–5.6)

## 2019-03-11 LAB — BUN+CREAT
BUN/Creatinine Ratio: 19 (ref 9–20)
BUN: 14 mg/dL (ref 6–24)
Creatinine, Ser: 0.73 mg/dL — ABNORMAL LOW (ref 0.76–1.27)
GFR calc Af Amer: 125 mL/min/{1.73_m2} (ref >59)
GFR calc non Af Amer: 108 mL/min/{1.73_m2} (ref >59)

## 2019-03-11 MED ORDER — BASAGLAR KWIKPEN 100 UNIT/ML ~~LOC~~ SOPN: 15.0000 [IU] | PEN_INJECTOR | Freq: Every day | SUBCUTANEOUS | 1 refills | Status: DC

## 2019-03-11 MED ORDER — METFORMIN HCL 1000 MG PO TABS: 1000.0000 mg | ORAL_TABLET | Freq: Two times a day (BID) | ORAL | 1 refills | Status: DC

## 2019-03-17 ENCOUNTER — Ambulatory Visit: Payer: 59

## 2019-03-18 ENCOUNTER — Other Ambulatory Visit: Payer: Self-pay

## 2019-03-18 ENCOUNTER — Ambulatory Visit: Payer: 59 | Admitting: Physician Assistant

## 2019-03-18 VITALS — BP 126/84 | HR 67 | Temp 99.0°F | Ht 73.0 in | Wt 184.8 lb

## 2019-03-18 DIAGNOSIS — E119 Type 2 diabetes mellitus without complications: Secondary | ICD-10-CM

## 2019-03-18 DIAGNOSIS — Z794 Long term (current) use of insulin: Secondary | ICD-10-CM

## 2019-03-18 NOTE — Progress Notes (Signed)
   Subjective: Diabetes follow-up    Patient ID: Paul Jacobs, male    DOB: 06/28/1970, 48 y.o.   MRN: [id number]  HPI Patient presents today for reevaluation of hemoglobin A1c.   Review of Systems Diabetes and hypothyroidism.    Objective:   Physical Exam Deferred       Assessment & Plan:  Discussed patient improved hemoglobin A1c from 6.2-6.0.  Patient advised continue lifestyle changes and take medication as directed.  Advised 59-month follow-up for hemoglobin A1c.

## 2019-03-28 ENCOUNTER — Encounter: Payer: Self-pay | Admitting: Internal Medicine

## 2019-03-28 ENCOUNTER — Other Ambulatory Visit: Payer: Self-pay | Admitting: Internal Medicine

## 2019-03-28 MED ORDER — NOVOLOG FLEXPEN 100 UNIT/ML ~~LOC~~ SOPN: PEN_INJECTOR | SUBCUTANEOUS | 1 refills | Status: DC

## 2019-03-28 NOTE — Telephone Encounter (Signed)
Electronic Rx novolog flexpen #1 RF1 sent to patient pharmacy of choice.  Patient to follow up with [facility] provider in 6 months HgbA1c 6.0 last office visit with PA Katrinka Blazing on 03/18/2019  Renal function/liver function stable per lab review.

## 2019-03-30 ENCOUNTER — Other Ambulatory Visit: Payer: Self-pay | Admitting: Physician Assistant

## 2019-03-30 DIAGNOSIS — Z794 Long term (current) use of insulin: Secondary | ICD-10-CM

## 2019-03-30 DIAGNOSIS — E119 Type 2 diabetes mellitus without complications: Secondary | ICD-10-CM

## 2019-03-30 MED ORDER — BD PEN NEEDLE NANO 2ND GEN 32G X 4 MM MISC: 2 refills | Status: DC

## 2019-03-30 NOTE — Telephone Encounter (Signed)
Patient seen by PA Katrinka Blazing 03/18/2019 and labs drawn prior to appt HgbA1c 6.0

## 2019-03-31 DIAGNOSIS — M9901 Segmental and somatic dysfunction of cervical region: Secondary | ICD-10-CM | POA: Diagnosis not present

## 2019-03-31 DIAGNOSIS — M9902 Segmental and somatic dysfunction of thoracic region: Secondary | ICD-10-CM | POA: Diagnosis not present

## 2019-03-31 DIAGNOSIS — M5412 Radiculopathy, cervical region: Secondary | ICD-10-CM | POA: Diagnosis not present

## 2019-03-31 DIAGNOSIS — M9903 Segmental and somatic dysfunction of lumbar region: Secondary | ICD-10-CM | POA: Diagnosis not present

## 2019-04-08 DIAGNOSIS — M67441 Ganglion, right hand: Secondary | ICD-10-CM | POA: Diagnosis not present

## 2019-04-21 DIAGNOSIS — M9903 Segmental and somatic dysfunction of lumbar region: Secondary | ICD-10-CM | POA: Diagnosis not present

## 2019-04-21 DIAGNOSIS — M9902 Segmental and somatic dysfunction of thoracic region: Secondary | ICD-10-CM | POA: Diagnosis not present

## 2019-04-21 DIAGNOSIS — M5412 Radiculopathy, cervical region: Secondary | ICD-10-CM | POA: Diagnosis not present

## 2019-04-21 DIAGNOSIS — M9901 Segmental and somatic dysfunction of cervical region: Secondary | ICD-10-CM | POA: Diagnosis not present

## 2019-05-01 ENCOUNTER — Other Ambulatory Visit: Payer: Self-pay | Admitting: Internal Medicine

## 2019-05-01 DIAGNOSIS — E119 Type 2 diabetes mellitus without complications: Secondary | ICD-10-CM

## 2019-05-01 DIAGNOSIS — Z794 Long term (current) use of insulin: Secondary | ICD-10-CM

## 2019-06-03 ENCOUNTER — Ambulatory Visit: Payer: Self-pay | Admitting: Registered Nurse

## 2019-06-03 ENCOUNTER — Encounter: Payer: Self-pay | Admitting: Registered Nurse

## 2019-06-03 ENCOUNTER — Other Ambulatory Visit: Payer: Self-pay

## 2019-06-03 VITALS — BP 130/70 | HR 80 | Temp 99.1°F | Resp 12 | Ht 74.0 in | Wt 180.0 lb

## 2019-06-03 DIAGNOSIS — E119 Type 2 diabetes mellitus without complications: Secondary | ICD-10-CM

## 2019-06-03 DIAGNOSIS — B351 Tinea unguium: Secondary | ICD-10-CM

## 2019-06-03 DIAGNOSIS — L570 Actinic keratosis: Secondary | ICD-10-CM

## 2019-06-03 DIAGNOSIS — Z794 Long term (current) use of insulin: Secondary | ICD-10-CM

## 2019-06-03 DIAGNOSIS — E109 Type 1 diabetes mellitus without complications: Secondary | ICD-10-CM

## 2019-06-03 LAB — POCT GLYCOSYLATED HEMOGLOBIN (HGB A1C): Hemoglobin A1C: 5.7 % — AB (ref 4.0–5.6)

## 2019-06-03 MED ORDER — BASAGLAR KWIKPEN 100 UNIT/ML ~~LOC~~ SOPN: 15.0000 [IU] | PEN_INJECTOR | Freq: Every day | SUBCUTANEOUS | 0 refills | Status: DC

## 2019-06-03 NOTE — Progress Notes (Signed)
Rx Refills needed: Basaglar - just used last refill and his other medications have another refill remaining.  ? Cryo on right ear or ? Referral to Dermatologist as nonhealing scab noted pinna denied bleeding but isn't healing.  A1c Now = 5.7  Completed by RN Paschal Dopp in clinic today.   AMD

## 2019-06-03 NOTE — Patient Instructions (Addendum)
Diabetes Mellitus and Foot Care Foot care is an important part of your health, especially when you have diabetes. Diabetes may cause you to have problems because of poor blood flow (circulation) to your feet and legs, which can cause your skin to:  Become thinner and drier.  Break more easily.  Heal more slowly.  Peel and crack. You may also have nerve damage (neuropathy) in your legs and feet, causing decreased feeling in them. This means that you may not notice minor injuries to your feet that could lead to more serious problems. Noticing and addressing any potential problems early is the best way to prevent future foot problems. How to care for your feet Foot hygiene  Wash your feet daily with warm water and mild soap. Do not use hot water. Then, pat your feet and the areas between your toes until they are completely dry. Do not soak your feet as this can dry your skin.  Trim your toenails straight across. Do not dig under them or around the cuticle. File the edges of your nails with an emery board or nail file.  Apply a moisturizing lotion or petroleum jelly to the skin on your feet and to dry, brittle toenails. Use lotion that does not contain alcohol and is unscented. Do not apply lotion between your toes. Shoes and socks  Wear clean socks or stockings every day. Make sure they are not too tight. Do not wear knee-high stockings since they may decrease blood flow to your legs.  Wear shoes that fit properly and have enough cushioning. Always look in your shoes before you put them on to be sure there are no objects inside.  To break in new shoes, wear them for just a few hours a day. This prevents injuries on your feet. Wounds, scrapes, corns, and calluses  Check your feet daily for blisters, cuts, bruises, sores, and redness. If you cannot see the bottom of your feet, use a mirror or ask someone for help.  Do not cut corns or calluses or try to remove them with medicine.  If you  find a minor scrape, cut, or break in the skin on your feet, keep it and the skin around it clean and dry. You may clean these areas with mild soap and water. Do not clean the area with peroxide, alcohol, or iodine.  If you have a wound, scrape, corn, or callus on your foot, look at it several times a day to make sure it is healing and not infected. Check for: ? Redness, swelling, or pain. ? Fluid or blood. ? Warmth. ? Pus or a bad smell. General instructions  Do not cross your legs. This may decrease blood flow to your feet.  Do not use heating pads or hot water bottles on your feet. They may burn your skin. If you have lost feeling in your feet or legs, you may not know this is happening until it is too late.  Protect your feet from hot and cold by wearing shoes, such as at the beach or on hot pavement.  Schedule a complete foot exam at least once a year (annually) or more often if you have foot problems. If you have foot problems, report any cuts, sores, or bruises to your health care provider immediately. Contact a health care provider if:  You have a medical condition that increases your risk of infection and you have any cuts, sores, or bruises on your feet.  You have an injury that is not   healing.  You have redness on your legs or feet.  You feel burning or tingling in your legs or feet.  You have pain or cramps in your legs and feet.  Your legs or feet are numb.  Your feet always feel cold.  You have pain around a toenail. Get help right away if:  You have a wound, scrape, corn, or callus on your foot and: ? You have pain, swelling, or redness that gets worse. ? You have fluid or blood coming from the wound, scrape, corn, or callus. ? Your wound, scrape, corn, or callus feels warm to the touch. ? You have pus or a bad smell coming from the wound, scrape, corn, or callus. ? You have a fever. ? You have a red line going up your leg. Summary  Check your feet every day  for cuts, sores, red spots, swelling, and blisters.  Moisturize feet and legs daily.  Wear shoes that fit properly and have enough cushioning.  If you have foot problems, report any cuts, sores, or bruises to your health care provider immediately.  Schedule a complete foot exam at least once a year (annually) or more often if you have foot problems. This information is not intended to replace advice given to you by your health care provider. Make sure you discuss any questions you have with your health care provider. Document Revised: 10/29/2018 Document Reviewed: 03/09/2016 Elsevier Patient Education  2020 [employer] Fungal Nail Infection A fungal nail infection is a common infection of the toenails or fingernails. This condition affects toenails more often than fingernails. It often affects the great, or big, toes. More than one nail may be infected. The condition can be passed from person to person (is contagious). What are the causes? This condition is caused by a fungus. Several types of fungi can cause the infection. These fungi are common in moist and warm areas. If your hands or feet come into contact with the fungus, it may get into a crack in your fingernail or toenail and cause the infection. What increases the risk? The following factors may make you more likely to develop this condition:  Being male.  Being of older age.  Living with someone who has the fungus.  Walking barefoot in areas where the fungus thrives, such as showers or locker rooms.  Wearing shoes and socks that cause your feet to sweat.  Having a nail injury or a recent nail surgery.  Having certain medical conditions, such as: ? Athlete's foot. ? Diabetes. ? Psoriasis. ? Poor circulation. ? A weak body defense system (immune system). What are the signs or symptoms? Symptoms of this condition include:  A pale spot on the nail.  Thickening of the nail.  A nail that becomes yellow or  brown.  A brittle or ragged nail edge.  A crumbling nail.  A nail that has lifted away from the nail bed. How is this diagnosed? This condition is diagnosed with a physical exam. Your health care provider may take a scraping or clipping from your nail to test for the fungus. How is this treated? Treatment is not needed for mild infections. If you have significant nail changes, treatment may include:  Antifungal medicines taken by mouth (orally). You may need to take the medicine for several weeks or several months, and you may not see the results for a long time. These medicines can cause side effects. Ask your health care provider what problems to watch for.  Antifungal nail  polish or nail cream. These may be used along with oral antifungal medicines.  Laser treatment of the nail.  Surgery to remove the nail. This may be needed for the most severe infections. It can take a long time, usually up to a year, for the infection to go away. The infection may also come back. Follow these instructions at home: Medicines  Take or apply over-the-counter and prescription medicines only as told by your health care provider.  Ask your health care provider about using over-the-counter mentholated ointment on your nails. Nail care  Trim your nails often.  Wash and dry your hands and feet every day.  Keep your feet dry: ? Wear absorbent socks, and change your socks frequently. ? Wear shoes that allow air to circulate, such as sandals or canvas tennis shoes. Throw out old shoes.  Do not use artificial nails.  If you go to a nail salon, make sure you choose one that uses clean instruments.  Use antifungal foot powder on your feet and in your shoes. General instructions  Do not share personal items, such as towels or nail clippers.  Do not walk barefoot in shower rooms or locker rooms.  Wear rubber gloves if you are working with your hands in wet areas.  Keep all follow-up visits as  told by your health care provider. This is important. Contact a health care provider if: Your infection is not getting better or it is getting worse after several months. Summary  A fungal nail infection is a common infection of the toenails or fingernails.  Treatment is not needed for mild infections. If you have significant nail changes, treatment may include taking medicine orally and applying medicine to your nails.  It can take a long time, usually up to a year, for the infection to go away. The infection may also come back.  Take or apply over-the-counter and prescription medicines only as told by your health care provider.  Follow instructions for taking care of your nails to help prevent infection from coming back or spreading. This information is not intended to replace advice given to you by your health care provider. Make sure you discuss any questions you have with your health care provider. Document Revised: 05/29/2018 Document Reviewed: 07/12/2017 Elsevier Patient Education  2020 [employer] Preventing Skin Cancer, Adult Skin cancer is the most common type of cancer. There are three main types. Squamous cell and basal cell skin cancer are the most common. Melanoma skin cancer is the most dangerous type. Most skin cancers are caused by skin damage from exposure to ultraviolet (UV) light. UV light comes from the sun and from artificial tanning beds. Suntans and sunburns result from exposure to UV light. Skin cancer occurs most often in older people, but it is usually the result of damage done earlier in life. The tans and sunburns you get at any age can lead to skin cancer in the future. To help prevent this, you can take steps to protect yourself. What actions can I take to protect myself from skin cancer? Many people like to get a tan, especially in the summer or when on vacation. However, tan or burned skin is a sign of skin damage. It increases your risk for skin cancer. To  lower your risk: Avoid exposure to UV light   Try to stay out of the sun between 10 a.m. and 4 p.m. whenever possible. This is when the sun is at its strongest. Seek the shade during this time.  Remember that you can also be exposed to UV rays on cloudy or hazy days. Wynelle LinkSun exposure can be risky year-round, not just in the summer.  Do not use a sunlamp, tanning bed, or tanning booth to get a tan. If you really want a tan, use an artificial tanning lotion.  Avoid getting sunburned. Sunburns are more common on bright sunny days, especially when you are in areas where the sun is reflected off water or snow. Use sunscreen and protective clothing   Always use sunscreen--either a cream, lotion, or spray--when you are out in the sun. Keep sunscreen handy, such as in your gym bag or in your car, so that you will have it when you need it.  Use a sunscreen with a sun protection factor (SPF) of at least 15. Use an SPF of 30 or higher if you are in bright sun, especially when you are out in the snow or on the water.  Make sure your sunscreen protects you from UVA and UVB light.  Use an adequate amount of sunscreen to cover exposed areas of skin. Put it on 30 minutes before you go out. Reapply it every 2 hours or anytime you come out of the water.  When you are out in the sun, wear a broad-brimmed hat and clothing that covers your arms and legs. Wear wraparound sunglasses. Check your skin for changes  Check your skin often from head to toe to look for any changes in the size, color, or shape of any moles or freckles. Check for any new moles or moles that bleed or become itchy. See your health care provider if you notice changes.  Ask your health care provider about a total skin check. Ask if it should be part of your yearly physical or if you need to see a skin specialist (dermatologist). Take other preventive measures   Avoid exposure to harmful chemicals, such as arsenic. ? Have your home's water  tested for arsenic and other chemicals. ? Take protective measures to avoid exposure to chemicals at work.  Do not smoke any tobacco products, such as cigarettes, cigars, pipes, and e-cigarettes. If you need help quitting, ask your health care provider.  Keep your immune system healthy. ? Stay up to date on all vaccines, including the human papillomavirus (HPV) vaccine. ? Eat at least 5 servings of fruits and vegetables every day. Why are these changes important? About 1 of every 5 people will get skin cancer. The best way to reduce your risk is to avoid skin damage from UV light. If you have teenagers in your house, they should know that just five bad sunburns as a teen could double their risk of skin cancer in the future. If you have younger children, always make sure to protect their skin from the sun. These changes can help reduce your risk of skin cancer, and they will also provide other health benefits, such as the following:  Protecting your skin from the sun can help prevent painful sunburns, sun poisoning, and other skin damage and blemishes. This is especially important if: ? You have pale white skin, freckles, and red hair. ? You burn easily.  Avoiding exposure to harmful chemicals can help prevent damage to other tissues in your body, such as your lungs, and prevent other types of cancer.  Avoiding smoking tobacco can reduce your risk for other types of cancer and other health problems.  Eating a healthy diet is good for your overall health. What can happen if changes  are not made? If you do not make these changes, you will be at higher risk for skin cancer. If you develop skin cancer, the treatments could result in lost time from work and changes in your appearance from scars. The most dangerous type of skin cancer, melanoma, can be deadly if not found early. Where to find support For more support, talk to your primary health care provider or dermatologist. Where to find more  information Learn more about skin cancer from:  [employer]: www.skincancer.org/prevention  The Centers for Disease Control and Prevention: [employer]  The American Academy of Dermatology: http://jones-macias.info/ Summary  Skin cancer is the most common type of cancer.  Melanoma skin cancer can be deadly if not found early.  Sunburns and tanning increase your risk for skin cancer.  Protecting your skin from UV light is the best way to prevent skin cancer. This information is not intended to replace advice given to you by your health care provider. Make sure you discuss any questions you have with your health care provider. Document Revised: 05/30/2018 Document Reviewed: 04/01/2017 Elsevier Patient Education  [place]. Actinic Keratosis An actinic keratosis is a precancerous growth on the skin. If there is more than one growth, the condition is called actinic keratoses. Actinic keratoses appear most often on areas of skin that get a lot of sun exposure, including the scalp, face, ears, lips, upper back, forearms, and the backs of the hands. If left untreated, these growths may develop into a skin cancer called squamous cell carcinoma. It is important to have all these growths checked by a health care provider to determine the best treatment approach. What are the causes? Actinic keratoses are caused by getting too much ultraviolet (UV) radiation from the sun or other UV light sources. What increases the risk? You are more likely to develop this condition if you:  Have light-colored skin and blue eyes.  Have blond or red hair.  Spend a lot of time in the sun.  Do not protect your skin from the sun when outdoors.  Are an older person. The risk of developing an actinic keratosis increases with age. What are the signs or symptoms? Actinic keratoses feel like scaly, rough spots of skin. Symptoms of this condition include growths that may:  Be as small as a  pinhead or as big as a quarter.  Itch, hurt, or feel sensitive.  Be skin-colored, light tan, dark tan, pink, or a combination of any of these colors. In most cases, the growths become red.  Have a small piece of pink or gray skin (skin tag) growing from them. It may be easier to notice actinic keratoses by feeling them, rather than seeing them. Sometimes, actinic keratoses disappear, but many reappear a few days to a few weeks later. How is this diagnosed? This condition is usually diagnosed with a physical exam.  A tissue sample may be removed from the actinic keratosis and examined under a microscope (biopsy). How is this treated? If needed, this condition may be treated by:  Scraping off the actinic keratosis (curettage).  Freezing the actinic keratosis with liquid nitrogen (cryosurgery). This causes the growth to eventually fall off the skin.  Applying medicated creams or gels to destroy the cells in the growth.  Applying chemicals to the actinic keratosis to make the outer layers of skin peel off (chemical peel).  Using photodynamic therapy. In this procedure, medicated cream is applied to the actinic keratosis. This cream increases your skin's  sensitivity to light. Then, a strong light is aimed at the actinic keratosis to destroy cells in the growth. Follow these instructions at home: Skin care  Apply cool, wet cloths (cool compresses) to the affected areas.  Do not scratch your skin.  Check your skin regularly for any growths, especially growths that: ? Start to itch or bleed. ? Change in size, shape, or color. Caring for the treated area  Keep the treated area clean and dry as told by your health care provider.  Do not apply any medicine, cream, or lotion to the treated area unless your health care provider tells you to do that.  Do not pick at blisters or try to break them open. This can cause infection and scarring.  If you have red or irritated skin after  treatment, follow instructions from your health care provider about how to take care of the treated area. Make sure you: ? Wash your hands with soap and water before you change your bandage (dressing). If soap and water are not available, use hand sanitizer. ? Change your dressing as told by your health care provider.  If you have red or irritated skin after treatment, check your treated area every day for signs of infection. Check for: ? Redness, swelling, or pain. ? Fluid or blood. ? Warmth. ? Pus or a bad smell. General instructions  Take or apply over-the-counter and prescription medicines only as told by your health care provider.  Return to your normal activities as told by your health care provider. Ask your health care provider what activities are safe for you.  Have a skin exam done every year by a health care provider who is a skin specialist (dermatologist).  Keep all follow-up visits as told by your health care provider. This is important. Lifestyle  Do not use any products that contain nicotine or tobacco, such as cigarettes and e-cigarettes. If you need help quitting, ask your health care provider.  Take steps to protect your skin from the sun. ? Try to avoid the sun between 10:00 a.m. and 4:00 p.m. This is when the UV light is the strongest. ? Use a sunscreen or sunblock with SPF 30 (sun protection factor 30) or greater. ? Apply sunscreen before you are exposed to sunlight and reapply as often as directed by the instructions on the sunscreen container. ? Always wear sunglasses that have UV protection, and always wear a hat and clothing to protect your skin from sunlight. ? When possible, avoid medicines that increase your sensitivity to sunlight. ? Do not use tanning beds or other indoor tanning devices. Contact a health care provider if:  You notice any changes or new growths on your skin.  You have swelling, pain, or more redness around your treated area.  You have  fluid or blood coming from your treated area.  Your treated area feels warm to the touch.  You have pus or a bad smell coming from your treated area.  You have a fever.  You have a blister that becomes large and painful. Summary  An actinic keratosis is a precancerous growth on the skin. If there is more than one growth, the condition is called actinic keratoses. In some cases, if left untreated, these growths can develop into skin cancer.  Check your skin regularly for any growths, especially growths that start to itch or bleed, or change in size, shape, or color.  Take steps to protect your skin from the sun.  Contact a health care  provider if you notice any changes or new growths on your skin.  Keep all follow-up visits as told by your health care provider. This is important. This information is not intended to replace advice given to you by your health care provider. Make sure you discuss any questions you have with your health care provider. Document Revised: 06/18/2017 Document Reviewed: 06/18/2017 Elsevier Patient Education  2020 [employer].

## 2019-06-04 ENCOUNTER — Other Ambulatory Visit: Payer: Self-pay | Admitting: Registered Nurse

## 2019-06-04 NOTE — Progress Notes (Signed)
Subjective:    Patient ID: Paul Jacobs, male    DOB: [birth year], 49 y.o.   MRN: [id number]  48y/o caucasian male established diabetic patient on insulin needs refill.  Next Hgba1c due in 3 months along with annual physical.  Unsure if foot exam in the past year.  Seeing dentist every 6 months for cleaning.  Annual optometry exam scheduled for next month denied visual changes.  Hasn't had covid vaccination yet wearing mask and social distancing.  Spot on right ear he would like to have cryotherapy.  Dr Despina Ariasabinowicz performed in the past and worked well for him.  Does not have established relationship with dermatologist.  Reports a lot of time in the sun throughout his life.  Monitoring skin for any changes in moles and has a couple on face and back he would like me to check.  Denied bleeding but has area on ear that has been scabbed for 6 months and not improving.  Brown freckle right cheek seems to be getting bigger.  Has been staying active and trying to eat healthy diet.   Right big toenail fungal infection worsening again wondering if he should start vinegar soaks. Does not want penlac or lamisil again.  Has tea tree oil at home also.  Typically wears pair of hiking boots same pair every day.  This toenail had cracked off midway last year and he thought fungus was gone but started to change colors and get brittle/nonadherent again.  Denied pain or discharge.     Review of Systems  Constitutional: Negative for activity change, appetite change, chills, diaphoresis, fatigue, fever and unexpected weight change.  HENT: Negative for trouble swallowing and voice change.   Eyes: Negative for photophobia and visual disturbance.  Respiratory: Negative for cough, shortness of breath, wheezing and stridor.   Cardiovascular: Negative for chest pain, palpitations and leg swelling.  Gastrointestinal: Negative for abdominal pain, diarrhea, nausea and vomiting.  Endocrine: Negative for cold intolerance, heat  intolerance, polydipsia, polyphagia and polyuria.  Genitourinary: Negative for difficulty urinating.  Musculoskeletal: Negative for arthralgias, back pain, gait problem, joint swelling, myalgias, neck pain and neck stiffness.  Skin: Positive for color change and rash. Negative for pallor and wound.  Allergic/Immunologic: Negative for environmental allergies, food allergies and immunocompromised state.  Neurological: Negative for dizziness, tremors, seizures, syncope, facial asymmetry, speech difficulty, weakness, light-headedness, numbness and headaches.  Hematological: Negative for adenopathy. Does not bruise/bleed easily.  Psychiatric/Behavioral: Negative for agitation, confusion and sleep disturbance.       Objective:   Physical Exam Vitals and nursing note reviewed.  Constitutional:      General: He is awake. He is not in acute distress.    Appearance: Normal appearance. He is well-developed, well-groomed and normal weight. He is not ill-appearing, toxic-appearing or diaphoretic.  HENT:     Head: Normocephalic and atraumatic.     Jaw: There is normal jaw occlusion. No trismus.     Salivary Glands: Right salivary gland is not diffusely enlarged or tender. Left salivary gland is not diffusely enlarged or tender.      Right Ear: Hearing and external ear normal.     Left Ear: Hearing and external ear normal.     Ears:      Nose: Nose normal. No nasal deformity, septal deviation, signs of injury, laceration, nasal tenderness, mucosal edema, congestion or rhinorrhea.     Right Sinus: No maxillary sinus tenderness or frontal sinus tenderness.     Left Sinus: No maxillary sinus  tenderness or frontal sinus tenderness.     Mouth/Throat:     Lips: Pink. No lesions.     Mouth: Mucous membranes are moist. Mucous membranes are not pale, not dry and not cyanotic. No lacerations, oral lesions or angioedema.     Dentition: No dental abscesses or gum lesions.     Tongue: No lesions. Tongue does  not deviate from midline.     Pharynx: Oropharynx is clear.  Eyes:     General: Lids are normal. Vision grossly intact. Gaze aligned appropriately. No allergic shiner, visual field deficit or scleral icterus.       Right eye: No foreign body, discharge or hordeolum.        Left eye: No foreign body, discharge or hordeolum.     Extraocular Movements: Extraocular movements intact.     Right eye: Normal extraocular motion and no nystagmus.     Left eye: Normal extraocular motion and no nystagmus.     Conjunctiva/sclera: Conjunctivae normal.     Right eye: Right conjunctiva is not injected. No chemosis, exudate or hemorrhage.    Left eye: Left conjunctiva is not injected. No chemosis, exudate or hemorrhage.    Pupils: Pupils are equal, round, and reactive to light. Pupils are equal.     Right eye: Pupil is round and reactive.     Left eye: Pupil is round and reactive.  Neck:     Thyroid: No thyromegaly.     Trachea: Trachea and phonation normal. No tracheal tenderness or tracheal deviation.  Cardiovascular:     Rate and Rhythm: Normal rate and regular rhythm.     Chest Wall: PMI is not displaced.     Pulses:          Radial pulses are 2+ on the right side and 2+ on the left side.       Dorsalis pedis pulses are 2+ on the right side and 2+ on the left side.     Heart sounds: Normal heart sounds, S1 normal and S2 normal. No murmur. No friction rub. No gallop.   Pulmonary:     Effort: Pulmonary effort is normal. No respiratory distress.     Breath sounds: Normal breath sounds and air entry. No stridor, decreased air movement or transmitted upper airway sounds. No decreased breath sounds, wheezing, rhonchi or rales.     Comments: Wearing cloth mask due to covid 19 pandemic; spoke full sentences without difficulty; no cough observed in exam room Abdominal:     General: Abdomen is flat. Bowel sounds are normal. There is no distension or abdominal bruit. There are no signs of injury.      Palpations: Abdomen is soft. There is no shifting dullness, fluid wave, hepatomegaly, splenomegaly, mass or pulsatile mass.     Tenderness: There is no abdominal tenderness.     Hernia: No hernia is present. There is no hernia in the umbilical area or ventral area.  Musculoskeletal:        General: No swelling, tenderness, deformity or signs of injury. Normal range of motion.     Right shoulder: Normal.     Left shoulder: Normal.     Right elbow: Normal.     Left elbow: Normal.     Right hand: Normal.     Left hand: Normal.     Cervical back: Normal, normal range of motion and neck supple. No swelling, edema, deformity, erythema, signs of trauma, lacerations, rigidity, torticollis or crepitus. No pain with movement. Normal range  of motion.     Thoracic back: Normal.     Lumbar back: Normal.     Right hip: Normal.     Left hip: Normal.     Right knee: Normal.     Left knee: Normal.     Right lower leg: No edema.     Left lower leg: No edema.     Right ankle: Normal.     Left ankle: Normal.     Right foot: Normal. Normal range of motion. No deformity, bunion, Charcot foot, foot drop or prominent metatarsal heads.     Left foot: Normal. Normal range of motion. No deformity, bunion, Charcot foot, foot drop or prominent metatarsal heads.       Feet:  Feet:     Right foot:     Protective Sensation: 7 sites tested. 7 sites sensed.     Skin integrity: Callus present. No ulcer, blister, skin breakdown, erythema, warmth, dry skin or fissure.     Toenail Condition: Right toenails are abnormally thick. Fungal disease present.    Left foot:     Protective Sensation: 7 sites tested. 7 sites sensed.     Skin integrity: Callus present. No ulcer, blister, skin breakdown, erythema, warmth, dry skin or fissure.     Toenail Condition: Left toenails are normal.  Lymphadenopathy:     Head:     Right side of head: No submental, submandibular, tonsillar, preauricular, posterior auricular or occipital  adenopathy.     Left side of head: No submental, submandibular, tonsillar, preauricular, posterior auricular or occipital adenopathy.     Cervical: No cervical adenopathy.     Right cervical: No superficial cervical adenopathy.    Left cervical: No superficial cervical adenopathy.  Skin:    General: Skin is warm and dry.     Coloration: Skin is not ashen, cyanotic, jaundiced, mottled, pale or sallow.     Findings: Abrasion, lesion and rash present. No abscess, acne, bruising, burn, ecchymosis, erythema, signs of injury, laceration, petechiae or wound. Rash is crusting and macular. Rash is not nodular, papular, purpuric, pustular, scaling, urticarial or vesicular.     Nails: There is no clubbing.          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     Mental Status: He is alert and oriented to person, place, and time.     GCS: GCS eye subscore is 4. GCS verbal subscore is 5. GCS motor subscore is 6.     Cranial Nerves: Cranial nerves are intact. No cranial nerve deficit, dysarthria or facial asymmetry.     Sensory: Sensation is intact. No sensory deficit.     Motor: Motor function is intact. No weakness, tremor, atrophy, abnormal muscle tone or seizure activity.     Coordination: Coordination is intact. Coordination normal.     Gait: Gait is intact. Gait normal.  Psychiatric:        Attention and Perception: Attention and perception normal.        Mood and Affect: Mood and affect normal.        Speech: Speech normal.        Behavior: Behavior normal. Behavior is cooperative.        Thought Content: Thought content normal.        Cognition and Memory: Cognition and memory  normal.        Judgment: Judgment normal.     Liquid nitrogen 5 seconds x 3 applications applied to lesion  right pinna no bleeding noted and freeze zone noted for 2-3 seconds before resolving each application with cotton applicator.  Patient denied pain mild erythema to treated area discussed with patient to wash with soap and water daily and monitor for signs and symptoms of infection/nonhealing.  May apply vaseline to affected area.  If scab returns chronic and does not heal completely/scab resolve completely I recommend follow up with dermatology for biopsy  Patient verbalized understanding information/instructions, agreed with plan of care and had no further questions at this time.      Assessment & Plan:  A-type II diabetes with insulin use, actinic keratosis, onychomycosis, evaluation for skin cancer, encounter for diabetic foot exam  P-Foot exam normal sensation some callous noted heels and right MTP only otherwise healthy moisturized skin without scale/hot spots or ulcers.  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  Congratulated patient on taking steps to improve diet/activity/taking medication every day emphasized healthy diet and activity daily main goals.  Continue his medications as prescribed.  Refilled basaglar kwikpen 100units/ml 15 units (o.46ml) SQ daily #1 13.40ml RF0 electronic rx to his pharmacy of choice.  Discontinued medications he did not start due to no insurance coverage with change from blue cross/blue shield to [place] last year.    Hgba1c stat today improved from previous 5.7.  Labs fasting HgbA1c Vitamin D, lipids, LFTs, BMET, microalbumin in 3 months along with follow up visit.   Ensure staying hydrated drink water to keep urine pale yellow clear and voiding every 4 hours when awake.  Avoid dehydration.   Patient refused dietitian/nutritional consult at this time. Avoid added sugars in diet/non-nutritional greater than 30gms per day.  Exitcare handout diabetes and foot care printed and given to patient.  Keep scheduled  optometry appt next month and provider previously notified of diabetes diagnosis.  Also notify dentist of diabetes diagnosis as more frequent cleanings/services may be covered by insurance with diabetes.  Apply emollient to feet each night e.g. vaseline/aquaphor/eucerin (squeeze or scoop container not pump as pump has alcohol in it and more drying).  Do visual foot inspection every day when putting on socks/shoes check for redness/sores.  Discussed high blood sugars can affect nerve function/altered sensation and may not noticed hot spots (red areas)/blisters or cuts especially between toes.  Patient verbalized understanding information/instructions, agreed with plan of care and had no further questions at this time.  ABCDEs discussed monitor moles for changes especially if bleeding, not healing, enlarging quickly follow up re-evaluation with a provider and consider punch biopsy/freezing.  Exitcare handout on preventing skin cancer and actinic keratosis printed and given to patient.   Discussed Asymmetrical, Border irregular, Changing color, Diameter enlarging especially greater than 1cm, Excoriation/Evolving lesion when performing self-skin exams. Continue self-skin exams on regular basis, wear protective clothing and sunscreen at least 30 SPF  Consider hat. Follow up if any of above symptoms noted and I recommend biopsy and due to diabetes and sun exposure history establishing relationship with dermatologist.  Patient verbalized understanding of instructions, agreed with plan of care and had no further questions at this time.   Thickened toenails with subungal debris c/w onychomycosis  Patient wants to try vinegar soaks.  Discussed has shown to be helpful.  If any other skin irritation then stop vinegar soaks as can  be irritating to other skin of foot.  Has used lamisil and penlac in the past does not want to restart.  Has tea tree oil at home may continue daily application to nail also.  Will requires months  of therapy as toenail needs up to one year or longer to grow out. Discussed antifungal powder in footwear. Rotating footwear (having 2 pairs shoes to alternate/dry out completely before rewear) and placing footwear in sun to dry out between use. Exitcare handout on onychomycosis printed and given to patient. Patient verbalized understanding and agreed with plan of care and had no further questions at this time.

## 2019-06-05 ENCOUNTER — Encounter: Payer: Self-pay | Admitting: Registered Nurse

## 2019-06-05 ENCOUNTER — Other Ambulatory Visit: Payer: Self-pay | Admitting: Registered Nurse

## 2019-06-05 DIAGNOSIS — E109 Type 1 diabetes mellitus without complications: Secondary | ICD-10-CM

## 2019-06-05 MED ORDER — BASAGLAR KWIKPEN 100 UNIT/ML ~~LOC~~ SOPN: 15.0000 [IU] | PEN_INJECTOR | Freq: Every day | SUBCUTANEOUS | 1 refills | Status: DC

## 2019-06-05 MED ORDER — NOVOLOG FLEXPEN 100 UNIT/ML ~~LOC~~ SOPN: PEN_INJECTOR | SUBCUTANEOUS | 1 refills | Status: DC

## 2019-06-05 NOTE — Telephone Encounter (Signed)
Last appt 06/03/2019 Hgba1c 5.7  Electronic Rx novolog flexpen 100units/ml inject 5-7 units under skin TID meal if blood sugar greater than 180 #15 RF0  sent to his pharmacy of choice as no refills left on Rx and next appt Jul 2021 for Hgba1c annual physical.

## 2019-06-18 ENCOUNTER — Ambulatory Visit: Payer: Managed Care, Other (non HMO)

## 2019-06-26 ENCOUNTER — Other Ambulatory Visit: Payer: Self-pay | Admitting: Internal Medicine

## 2019-06-26 DIAGNOSIS — N529 Male erectile dysfunction, unspecified: Secondary | ICD-10-CM

## 2019-07-16 DIAGNOSIS — I781 Nevus, non-neoplastic: Secondary | ICD-10-CM | POA: Diagnosis not present

## 2019-07-16 DIAGNOSIS — L570 Actinic keratosis: Secondary | ICD-10-CM | POA: Diagnosis not present

## 2019-07-16 DIAGNOSIS — L578 Other skin changes due to chronic exposure to nonionizing radiation: Secondary | ICD-10-CM | POA: Diagnosis not present

## 2019-08-21 ENCOUNTER — Other Ambulatory Visit: Payer: Self-pay | Admitting: Physician Assistant

## 2019-08-21 DIAGNOSIS — E119 Type 2 diabetes mellitus without complications: Secondary | ICD-10-CM

## 2019-08-21 DIAGNOSIS — Z794 Long term (current) use of insulin: Secondary | ICD-10-CM

## 2019-08-26 ENCOUNTER — Other Ambulatory Visit: Payer: Self-pay

## 2019-08-26 ENCOUNTER — Ambulatory Visit: Payer: Self-pay

## 2019-08-26 DIAGNOSIS — R319 Hematuria, unspecified: Secondary | ICD-10-CM

## 2019-08-26 DIAGNOSIS — Z0000 Encounter for general adult medical examination without abnormal findings: Secondary | ICD-10-CM

## 2019-08-26 LAB — POCT URINALYSIS DIPSTICK
Bilirubin, UA: NEGATIVE
Blood, UA: POSITIVE
Glucose, UA: POSITIVE — AB
Ketones, UA: NEGATIVE
Leukocytes, UA: NEGATIVE
Nitrite, UA: NEGATIVE
Protein, UA: NEGATIVE
Spec Grav, UA: >=1.03 — AB (ref 1.010–1.025)
Urobilinogen, UA: 0.2 E.U./dL
pH, UA: 5.5 (ref 5.0–8.0)

## 2019-08-26 NOTE — Progress Notes (Signed)
Scheduled to complete physical 09/02/19 with [name] Smith, PA-C.  AMD 

## 2019-08-26 NOTE — Addendum Note (Signed)
Addended by: Gardner Candle on: 08/26/2019 09:44 AM   Modules accepted: Orders

## 2019-08-28 LAB — CMP12+LP+TP+TSH+6AC+PSA+CBC…
ALT: 11 IU/L (ref 0–44)
AST: 9 IU/L (ref 0–40)
Albumin/Globulin Ratio: 1.7 (ref 1.2–2.2)
Albumin: 3.7 g/dL — ABNORMAL LOW (ref 4.0–5.0)
Alkaline Phosphatase: 46 IU/L — ABNORMAL LOW (ref 48–121)
BUN/Creatinine Ratio: 23 — ABNORMAL HIGH (ref 9–20)
BUN: 18 mg/dL (ref 6–24)
Basophils Absolute: 0 10*3/uL (ref 0.0–0.2)
Basos: 1 %
Bilirubin Total: 0.2 mg/dL (ref 0.0–1.2)
Calcium: 9.1 mg/dL (ref 8.7–10.2)
Chloride: 106 mmol/L (ref 96–106)
Chol/HDL Ratio: 2.7 ratio (ref 0.0–5.0)
Cholesterol, Total: 151 mg/dL (ref 100–199)
Creatinine, Ser: 0.78 mg/dL (ref 0.76–1.27)
EOS (ABSOLUTE): 0.3 10*3/uL (ref 0.0–0.4)
Eos: 4 %
Estimated CHD Risk: <0.5 times avg. (ref 0.0–1.0)
Free Thyroxine Index: 2.1 (ref 1.2–4.9)
GFR calc Af Amer: 122 mL/min/{1.73_m2} (ref >59)
GFR calc non Af Amer: 105 mL/min/{1.73_m2} (ref >59)
GGT: 8 IU/L (ref 0–65)
Globulin, Total: 2.2 g/dL (ref 1.5–4.5)
Glucose: 173 mg/dL — ABNORMAL HIGH (ref 65–99)
HDL: 55 mg/dL (ref >39)
Hematocrit: 44.3 % (ref 37.5–51.0)
Hemoglobin: 14.9 g/dL (ref 13.0–17.7)
Immature Grans (Abs): 0 10*3/uL (ref 0.0–0.1)
Immature Granulocytes: 0 %
Iron: 61 ug/dL (ref 38–169)
LDH: 120 IU/L — ABNORMAL LOW (ref 121–224)
LDL Chol Calc (NIH): 82 mg/dL (ref 0–99)
Lymphocytes Absolute: 1.8 10*3/uL (ref 0.7–3.1)
Lymphs: 32 %
MCH: 30.5 pg (ref 26.6–33.0)
MCHC: 33.6 g/dL (ref 31.5–35.7)
MCV: 91 fL (ref 79–97)
Monocytes Absolute: 0.4 10*3/uL (ref 0.1–0.9)
Monocytes: 8 %
Neutrophils Absolute: 3.2 10*3/uL (ref 1.4–7.0)
Neutrophils: 55 %
Phosphorus: 3.2 mg/dL (ref 2.8–4.1)
Platelets: 255 10*3/uL (ref 150–450)
Potassium: 4.4 mmol/L (ref 3.5–5.2)
Prostate Specific Ag, Serum: 0.8 ng/mL (ref 0.0–4.0)
RBC: 4.88 x10E6/uL (ref 4.14–5.80)
RDW: 12.7 % (ref 11.6–15.4)
Sodium: 140 mmol/L (ref 134–144)
T3 Uptake Ratio: 28 % (ref 24–39)
T4, Total: 7.6 ug/dL (ref 4.5–12.0)
TSH: 4.02 u[IU]/mL (ref 0.450–4.500)
Total Protein: 5.9 g/dL — ABNORMAL LOW (ref 6.0–8.5)
Triglycerides: 70 mg/dL (ref 0–149)
Uric Acid: 3.4 mg/dL — ABNORMAL LOW (ref 3.8–8.4)
VLDL Cholesterol Cal: 14 mg/dL (ref 5–40)
WBC: 5.7 10*3/uL (ref 3.4–10.8)

## 2019-08-28 LAB — MICROALBUMIN / CREATININE URINE RATIO
Creatinine, Urine: 113.4 mg/dL
Microalb/Creat Ratio: <3 mg/g creat (ref 0–29)
Microalbumin, Urine: <3 ug/mL

## 2019-08-28 LAB — HGB A1C W/O EAG: Hgb A1c MFr Bld: 6.4 % — ABNORMAL HIGH (ref 4.8–5.6)

## 2019-08-28 LAB — URINE CULTURE: Organism ID, Bacteria: NO GROWTH

## 2019-08-29 DIAGNOSIS — M9901 Segmental and somatic dysfunction of cervical region: Secondary | ICD-10-CM | POA: Diagnosis not present

## 2019-08-29 DIAGNOSIS — M9902 Segmental and somatic dysfunction of thoracic region: Secondary | ICD-10-CM | POA: Diagnosis not present

## 2019-08-29 DIAGNOSIS — M9903 Segmental and somatic dysfunction of lumbar region: Secondary | ICD-10-CM | POA: Diagnosis not present

## 2019-08-29 DIAGNOSIS — M5412 Radiculopathy, cervical region: Secondary | ICD-10-CM | POA: Diagnosis not present

## 2019-09-02 ENCOUNTER — Other Ambulatory Visit: Payer: Self-pay | Admitting: Physician Assistant

## 2019-09-02 ENCOUNTER — Ambulatory Visit: Payer: Self-pay | Admitting: Physician Assistant

## 2019-09-02 ENCOUNTER — Other Ambulatory Visit: Payer: Self-pay

## 2019-09-02 VITALS — BP 134/89 | HR 77 | Temp 98.6°F | Resp 14 | Ht 74.0 in | Wt 183.0 lb

## 2019-09-02 DIAGNOSIS — M9901 Segmental and somatic dysfunction of cervical region: Secondary | ICD-10-CM | POA: Diagnosis not present

## 2019-09-02 DIAGNOSIS — Z0000 Encounter for general adult medical examination without abnormal findings: Secondary | ICD-10-CM

## 2019-09-02 DIAGNOSIS — M9902 Segmental and somatic dysfunction of thoracic region: Secondary | ICD-10-CM | POA: Diagnosis not present

## 2019-09-02 DIAGNOSIS — M5412 Radiculopathy, cervical region: Secondary | ICD-10-CM | POA: Diagnosis not present

## 2019-09-02 DIAGNOSIS — B351 Tinea unguium: Secondary | ICD-10-CM

## 2019-09-02 DIAGNOSIS — M9903 Segmental and somatic dysfunction of lumbar region: Secondary | ICD-10-CM | POA: Diagnosis not present

## 2019-09-02 MED ORDER — CICLOPIROX 8 % EX SOLN: Freq: Every day | CUTANEOUS | 6 refills | Status: DC

## 2019-09-02 NOTE — Progress Notes (Signed)
   Subjective: Annual physical exam    Patient ID: Paul Jacobs, male    DOB: Jan 05, 1971, 49 y.o.   MRN: [id number]  HPI Patient presents for annual physical exam.  Patient voices no concerns or complaints.   Review of Systems Diabetes and hypothyroidism.    Objective:   Physical Exam No acute distress.  HEENT is unremarkable.  Neck is supple without adenopathy or bruits.  Lungs are clear to auscultation.  Heart regular rate and rhythm.  Abdomen negative HSM, normoactive bowel sounds, nontender to palpation.  No obvious deformity to the upper or lower extremities.  Patient had full and equal range of motion of the upper and lower extremities.  No obvious cervical or lumbar spine deformity.  Patient has full and equal range of motion of the cervical lumbar spine.  Cranial nerves II through XII grossly intact.      Assessment & Plan: Well exam.  Discussed lab results with patient most concerned about elevated glucose of 173.  Patient hemoglobin A1c was 6.4.  Patient admits to eating later at night because a spike in his a.m. glucose readings.  Patient also requested a prescription for onychomycosis of great right toenail.

## 2019-09-04 DIAGNOSIS — M9902 Segmental and somatic dysfunction of thoracic region: Secondary | ICD-10-CM | POA: Diagnosis not present

## 2019-09-04 DIAGNOSIS — M9901 Segmental and somatic dysfunction of cervical region: Secondary | ICD-10-CM | POA: Diagnosis not present

## 2019-09-04 DIAGNOSIS — M5412 Radiculopathy, cervical region: Secondary | ICD-10-CM | POA: Diagnosis not present

## 2019-09-04 DIAGNOSIS — M9903 Segmental and somatic dysfunction of lumbar region: Secondary | ICD-10-CM | POA: Diagnosis not present

## 2019-09-05 ENCOUNTER — Other Ambulatory Visit: Payer: Self-pay | Admitting: Physician Assistant

## 2019-09-05 DIAGNOSIS — E119 Type 2 diabetes mellitus without complications: Secondary | ICD-10-CM

## 2019-09-05 DIAGNOSIS — Z794 Long term (current) use of insulin: Secondary | ICD-10-CM

## 2019-09-07 DIAGNOSIS — M9901 Segmental and somatic dysfunction of cervical region: Secondary | ICD-10-CM | POA: Diagnosis not present

## 2019-09-07 DIAGNOSIS — M9902 Segmental and somatic dysfunction of thoracic region: Secondary | ICD-10-CM | POA: Diagnosis not present

## 2019-09-07 DIAGNOSIS — M9903 Segmental and somatic dysfunction of lumbar region: Secondary | ICD-10-CM | POA: Diagnosis not present

## 2019-09-07 DIAGNOSIS — M5412 Radiculopathy, cervical region: Secondary | ICD-10-CM | POA: Diagnosis not present

## 2019-09-11 DIAGNOSIS — M9901 Segmental and somatic dysfunction of cervical region: Secondary | ICD-10-CM | POA: Diagnosis not present

## 2019-09-11 DIAGNOSIS — M9903 Segmental and somatic dysfunction of lumbar region: Secondary | ICD-10-CM | POA: Diagnosis not present

## 2019-09-11 DIAGNOSIS — M5412 Radiculopathy, cervical region: Secondary | ICD-10-CM | POA: Diagnosis not present

## 2019-09-11 DIAGNOSIS — M9902 Segmental and somatic dysfunction of thoracic region: Secondary | ICD-10-CM | POA: Diagnosis not present

## 2019-09-14 DIAGNOSIS — M9902 Segmental and somatic dysfunction of thoracic region: Secondary | ICD-10-CM | POA: Diagnosis not present

## 2019-09-14 DIAGNOSIS — M5412 Radiculopathy, cervical region: Secondary | ICD-10-CM | POA: Diagnosis not present

## 2019-09-14 DIAGNOSIS — M5416 Radiculopathy, lumbar region: Secondary | ICD-10-CM | POA: Diagnosis not present

## 2019-09-14 DIAGNOSIS — M9901 Segmental and somatic dysfunction of cervical region: Secondary | ICD-10-CM | POA: Diagnosis not present

## 2019-09-14 DIAGNOSIS — M9903 Segmental and somatic dysfunction of lumbar region: Secondary | ICD-10-CM | POA: Diagnosis not present

## 2019-09-17 DIAGNOSIS — M9902 Segmental and somatic dysfunction of thoracic region: Secondary | ICD-10-CM | POA: Diagnosis not present

## 2019-09-17 DIAGNOSIS — M5412 Radiculopathy, cervical region: Secondary | ICD-10-CM | POA: Diagnosis not present

## 2019-09-17 DIAGNOSIS — M9903 Segmental and somatic dysfunction of lumbar region: Secondary | ICD-10-CM | POA: Diagnosis not present

## 2019-09-17 DIAGNOSIS — M9901 Segmental and somatic dysfunction of cervical region: Secondary | ICD-10-CM | POA: Diagnosis not present

## 2019-09-18 ENCOUNTER — Other Ambulatory Visit: Payer: Self-pay

## 2019-09-21 DIAGNOSIS — M9902 Segmental and somatic dysfunction of thoracic region: Secondary | ICD-10-CM | POA: Diagnosis not present

## 2019-09-21 DIAGNOSIS — M5412 Radiculopathy, cervical region: Secondary | ICD-10-CM | POA: Diagnosis not present

## 2019-09-21 DIAGNOSIS — M9901 Segmental and somatic dysfunction of cervical region: Secondary | ICD-10-CM | POA: Diagnosis not present

## 2019-09-21 DIAGNOSIS — M9903 Segmental and somatic dysfunction of lumbar region: Secondary | ICD-10-CM | POA: Diagnosis not present

## 2019-09-24 DIAGNOSIS — M9901 Segmental and somatic dysfunction of cervical region: Secondary | ICD-10-CM | POA: Diagnosis not present

## 2019-09-24 DIAGNOSIS — M5412 Radiculopathy, cervical region: Secondary | ICD-10-CM | POA: Diagnosis not present

## 2019-09-24 DIAGNOSIS — M9903 Segmental and somatic dysfunction of lumbar region: Secondary | ICD-10-CM | POA: Diagnosis not present

## 2019-09-24 DIAGNOSIS — M9902 Segmental and somatic dysfunction of thoracic region: Secondary | ICD-10-CM | POA: Diagnosis not present

## 2019-09-28 DIAGNOSIS — M9901 Segmental and somatic dysfunction of cervical region: Secondary | ICD-10-CM | POA: Diagnosis not present

## 2019-09-28 DIAGNOSIS — M9902 Segmental and somatic dysfunction of thoracic region: Secondary | ICD-10-CM | POA: Diagnosis not present

## 2019-09-28 DIAGNOSIS — M9903 Segmental and somatic dysfunction of lumbar region: Secondary | ICD-10-CM | POA: Diagnosis not present

## 2019-09-28 DIAGNOSIS — M5412 Radiculopathy, cervical region: Secondary | ICD-10-CM | POA: Diagnosis not present

## 2019-10-01 DIAGNOSIS — M9902 Segmental and somatic dysfunction of thoracic region: Secondary | ICD-10-CM | POA: Diagnosis not present

## 2019-10-01 DIAGNOSIS — M9901 Segmental and somatic dysfunction of cervical region: Secondary | ICD-10-CM | POA: Diagnosis not present

## 2019-10-01 DIAGNOSIS — M9903 Segmental and somatic dysfunction of lumbar region: Secondary | ICD-10-CM | POA: Diagnosis not present

## 2019-10-01 DIAGNOSIS — M5412 Radiculopathy, cervical region: Secondary | ICD-10-CM | POA: Diagnosis not present

## 2019-10-06 DIAGNOSIS — M9902 Segmental and somatic dysfunction of thoracic region: Secondary | ICD-10-CM | POA: Diagnosis not present

## 2019-10-06 DIAGNOSIS — M5412 Radiculopathy, cervical region: Secondary | ICD-10-CM | POA: Diagnosis not present

## 2019-10-06 DIAGNOSIS — M9903 Segmental and somatic dysfunction of lumbar region: Secondary | ICD-10-CM | POA: Diagnosis not present

## 2019-10-06 DIAGNOSIS — M9901 Segmental and somatic dysfunction of cervical region: Secondary | ICD-10-CM | POA: Diagnosis not present

## 2019-10-13 DIAGNOSIS — M9902 Segmental and somatic dysfunction of thoracic region: Secondary | ICD-10-CM | POA: Diagnosis not present

## 2019-10-13 DIAGNOSIS — M9903 Segmental and somatic dysfunction of lumbar region: Secondary | ICD-10-CM | POA: Diagnosis not present

## 2019-10-13 DIAGNOSIS — M5412 Radiculopathy, cervical region: Secondary | ICD-10-CM | POA: Diagnosis not present

## 2019-10-13 DIAGNOSIS — M9901 Segmental and somatic dysfunction of cervical region: Secondary | ICD-10-CM | POA: Diagnosis not present

## 2019-10-20 DIAGNOSIS — M9901 Segmental and somatic dysfunction of cervical region: Secondary | ICD-10-CM | POA: Diagnosis not present

## 2019-10-20 DIAGNOSIS — M9902 Segmental and somatic dysfunction of thoracic region: Secondary | ICD-10-CM | POA: Diagnosis not present

## 2019-10-20 DIAGNOSIS — M5412 Radiculopathy, cervical region: Secondary | ICD-10-CM | POA: Diagnosis not present

## 2019-10-20 DIAGNOSIS — M9903 Segmental and somatic dysfunction of lumbar region: Secondary | ICD-10-CM | POA: Diagnosis not present

## 2019-10-29 NOTE — Progress Notes (Signed)
Presents to [facility] for outdoor collection of specimen for covid testing.  S/Sx started  Known exposure  Fully vaccinated  Has Mychart

## 2019-10-30 ENCOUNTER — Other Ambulatory Visit: Payer: Self-pay

## 2019-10-30 DIAGNOSIS — Z1152 Encounter for screening for COVID-19: Secondary | ICD-10-CM

## 2019-11-02 ENCOUNTER — Other Ambulatory Visit: Payer: Self-pay

## 2019-11-02 DIAGNOSIS — E039 Hypothyroidism, unspecified: Secondary | ICD-10-CM

## 2019-11-02 LAB — NOVEL CORONAVIRUS, NAA: SARS-CoV-2, NAA: NOT DETECTED

## 2019-11-02 LAB — SARS-COV-2, NAA 2 DAY TAT

## 2019-11-02 MED ORDER — LEVOTHYROXINE SODIUM 50 MCG PO TABS: 50.0000 ug | ORAL_TABLET | Freq: Every day | ORAL | 3 refills | Status: DC

## 2019-11-11 DIAGNOSIS — M9901 Segmental and somatic dysfunction of cervical region: Secondary | ICD-10-CM | POA: Diagnosis not present

## 2019-11-11 DIAGNOSIS — M5412 Radiculopathy, cervical region: Secondary | ICD-10-CM | POA: Diagnosis not present

## 2019-11-11 DIAGNOSIS — M9902 Segmental and somatic dysfunction of thoracic region: Secondary | ICD-10-CM | POA: Diagnosis not present

## 2019-11-11 DIAGNOSIS — M9903 Segmental and somatic dysfunction of lumbar region: Secondary | ICD-10-CM | POA: Diagnosis not present

## 2019-11-18 DIAGNOSIS — M9903 Segmental and somatic dysfunction of lumbar region: Secondary | ICD-10-CM | POA: Diagnosis not present

## 2019-11-18 DIAGNOSIS — M9902 Segmental and somatic dysfunction of thoracic region: Secondary | ICD-10-CM | POA: Diagnosis not present

## 2019-11-18 DIAGNOSIS — M9901 Segmental and somatic dysfunction of cervical region: Secondary | ICD-10-CM | POA: Diagnosis not present

## 2019-11-18 DIAGNOSIS — M5412 Radiculopathy, cervical region: Secondary | ICD-10-CM | POA: Diagnosis not present

## 2019-11-26 ENCOUNTER — Other Ambulatory Visit: Payer: Self-pay

## 2019-11-26 NOTE — Telephone Encounter (Signed)
Rx refill request already in Epic - routed to [name] Smith, PA-C.  AMD 

## 2019-12-02 DIAGNOSIS — M9903 Segmental and somatic dysfunction of lumbar region: Secondary | ICD-10-CM | POA: Diagnosis not present

## 2019-12-02 DIAGNOSIS — M9902 Segmental and somatic dysfunction of thoracic region: Secondary | ICD-10-CM | POA: Diagnosis not present

## 2019-12-02 DIAGNOSIS — M9901 Segmental and somatic dysfunction of cervical region: Secondary | ICD-10-CM | POA: Diagnosis not present

## 2019-12-02 DIAGNOSIS — M5412 Radiculopathy, cervical region: Secondary | ICD-10-CM | POA: Diagnosis not present

## 2019-12-14 ENCOUNTER — Encounter: Payer: Self-pay | Admitting: Physician Assistant

## 2019-12-14 ENCOUNTER — Other Ambulatory Visit: Payer: Self-pay | Admitting: Physician Assistant

## 2019-12-14 DIAGNOSIS — E109 Type 1 diabetes mellitus without complications: Secondary | ICD-10-CM

## 2019-12-16 ENCOUNTER — Ambulatory Visit: Payer: 59

## 2019-12-16 DIAGNOSIS — M9901 Segmental and somatic dysfunction of cervical region: Secondary | ICD-10-CM | POA: Diagnosis not present

## 2019-12-16 DIAGNOSIS — M5412 Radiculopathy, cervical region: Secondary | ICD-10-CM | POA: Diagnosis not present

## 2019-12-16 DIAGNOSIS — M9902 Segmental and somatic dysfunction of thoracic region: Secondary | ICD-10-CM | POA: Diagnosis not present

## 2019-12-16 DIAGNOSIS — M9903 Segmental and somatic dysfunction of lumbar region: Secondary | ICD-10-CM | POA: Diagnosis not present

## 2019-12-17 ENCOUNTER — Ambulatory Visit: Payer: Self-pay | Admitting: Nurse Practitioner

## 2019-12-17 ENCOUNTER — Other Ambulatory Visit: Payer: Self-pay

## 2019-12-17 ENCOUNTER — Encounter: Payer: Self-pay | Admitting: Nurse Practitioner

## 2019-12-17 VITALS — BP 127/81 | HR 62 | Temp 97.4°F | Resp 14 | Ht 74.0 in | Wt 184.0 lb

## 2019-12-17 DIAGNOSIS — S6991XA Unspecified injury of right wrist, hand and finger(s), initial encounter: Secondary | ICD-10-CM

## 2019-12-17 NOTE — Progress Notes (Addendum)
  Subjective:     Patient ID: Paul Jacobs, male   DOB: 1970-07-28, 49 y.o.   MRN: [id number]  HPI Paul Jacobs is a 49 y.o. male who presents to the [facility] for evaluation of his right little finger. Employee reports that about a month ago he was moving wood and got hit right little finger caught between the wood and mashed his finger. He reports no break in the skin or FB at time of injury. Since the injury he has continued to have swelling and redness to the area. He denies decreased sensation and reports that the pain is much better than at the time of the injury. His concern is the redness that has not gone away. He takes ibuprofen as needed.   Review of Systems  Musculoskeletal: Positive for joint swelling (right little finger).  All other systems reviewed and are negative.      Objective: BP 127/81   Pulse 62   Temp (!) 97.4 F (36.3 C)   Resp 14   Ht 6\' 2"  (1.88 m)   Wt 184 lb (83.5 kg)   SpO2 99%   BMI 23.62 kg/m     Physical Exam Vitals and nursing note reviewed.  Cardiovascular:     Rate and Rhythm: Normal rate.  Pulmonary:     Effort: Pulmonary effort is normal.  Musculoskeletal:     Right hand: Swelling and tenderness present. Normal range of motion. Normal strength. Normal sensation.       Hands:     Comments: Dorsum of the right 5th digit with swelling and redness at the DIP. No red streaking or signs of infection.         Assessment:    49 y.o. male with pain of the hand at the right 5th digit DIP s/p injury that occurred about a month ago.     Plan:    X-ray of the injured area and call with results. I do not feel that antibiotics are indicated at this time. The area has no red streaking or increased warmth and per employee redness has not increased since the injury.     DG Finger Little Right  Result Date: 12/18/2019 CLINICAL DATA:  Injury.  Redness, swelling, and pain. EXAM: RIGHT LITTLE FINGER 2+V COMPARISON:  No prior. FINDINGS: Subtle  nondisplaced fracture of the distal aspect of the distal phalanx of the right fifth digit cannot be excluded. Tiny bony density noted the base of the distal phalanx of the right fifth digit. This may be degenerative. Tiny fracture chip cannot be excluded. Exam otherwise unremarkable. No radiopaque foreign body. IMPRESSION: 1. Subtle nondisplaced fracture of the distal aspect of the distal phalanx of the right fifth digit cannot be excluded. 2. Tiny bony density noted the base of the distal phalanx of the right fifth digit. This may be degenerative. Tiny fracture chip cannot be excluded. Electronically Signed   By: 12/20/2019  Register   On: 12/18/2019 08:52    Results of x-ray called to employee. Discussed f/u with ortho. Employee will call for appointment.

## 2019-12-18 ENCOUNTER — Ambulatory Visit
Admission: RE | Admit: 2019-12-18 | Discharge: 2019-12-18 | Disposition: A | Discharge status: Home / Self Care | Payer: 59 | Attending: Nurse Practitioner | Admitting: Nurse Practitioner

## 2019-12-18 ENCOUNTER — Ambulatory Visit
Admission: RE | Admit: 2019-12-18 | Discharge: 2019-12-18 | Disposition: A | Discharge status: Home / Self Care | Payer: 59 | Source: Ambulatory Visit | Attending: Nurse Practitioner | Admitting: Nurse Practitioner

## 2019-12-18 DIAGNOSIS — M7989 Other specified soft tissue disorders: Secondary | ICD-10-CM | POA: Diagnosis not present

## 2019-12-18 DIAGNOSIS — S6991XA Unspecified injury of right wrist, hand and finger(s), initial encounter: Secondary | ICD-10-CM | POA: Diagnosis not present

## 2019-12-30 DIAGNOSIS — M9901 Segmental and somatic dysfunction of cervical region: Secondary | ICD-10-CM | POA: Diagnosis not present

## 2019-12-30 DIAGNOSIS — M9903 Segmental and somatic dysfunction of lumbar region: Secondary | ICD-10-CM | POA: Diagnosis not present

## 2019-12-30 DIAGNOSIS — M9902 Segmental and somatic dysfunction of thoracic region: Secondary | ICD-10-CM | POA: Diagnosis not present

## 2019-12-30 DIAGNOSIS — M5412 Radiculopathy, cervical region: Secondary | ICD-10-CM | POA: Diagnosis not present

## 2020-01-04 DIAGNOSIS — S62639A Displaced fracture of distal phalanx of unspecified finger, initial encounter for closed fracture: Secondary | ICD-10-CM | POA: Insufficient documentation

## 2020-01-04 DIAGNOSIS — S62636A Displaced fracture of distal phalanx of right little finger, initial encounter for closed fracture: Secondary | ICD-10-CM | POA: Diagnosis not present

## 2020-01-07 DIAGNOSIS — B351 Tinea unguium: Secondary | ICD-10-CM | POA: Diagnosis not present

## 2020-01-07 DIAGNOSIS — E109 Type 1 diabetes mellitus without complications: Secondary | ICD-10-CM | POA: Diagnosis not present

## 2020-01-13 DIAGNOSIS — M9901 Segmental and somatic dysfunction of cervical region: Secondary | ICD-10-CM | POA: Diagnosis not present

## 2020-01-13 DIAGNOSIS — M9902 Segmental and somatic dysfunction of thoracic region: Secondary | ICD-10-CM | POA: Diagnosis not present

## 2020-01-13 DIAGNOSIS — M5412 Radiculopathy, cervical region: Secondary | ICD-10-CM | POA: Diagnosis not present

## 2020-01-13 DIAGNOSIS — M9903 Segmental and somatic dysfunction of lumbar region: Secondary | ICD-10-CM | POA: Diagnosis not present

## 2020-01-20 ENCOUNTER — Encounter: Payer: Self-pay | Admitting: Physician Assistant

## 2020-01-21 ENCOUNTER — Other Ambulatory Visit: Payer: Self-pay

## 2020-01-21 DIAGNOSIS — E109 Type 1 diabetes mellitus without complications: Secondary | ICD-10-CM

## 2020-01-21 MED ORDER — BASAGLAR KWIKPEN 100 UNIT/ML ~~LOC~~ SOPN: 15.0000 [IU] | PEN_INJECTOR | Freq: Every day | SUBCUTANEOUS | 3 refills | Status: DC

## 2020-01-26 DIAGNOSIS — M9902 Segmental and somatic dysfunction of thoracic region: Secondary | ICD-10-CM | POA: Diagnosis not present

## 2020-01-26 DIAGNOSIS — M9903 Segmental and somatic dysfunction of lumbar region: Secondary | ICD-10-CM | POA: Diagnosis not present

## 2020-01-26 DIAGNOSIS — M9901 Segmental and somatic dysfunction of cervical region: Secondary | ICD-10-CM | POA: Diagnosis not present

## 2020-01-26 DIAGNOSIS — M5412 Radiculopathy, cervical region: Secondary | ICD-10-CM | POA: Diagnosis not present

## 2020-02-08 DIAGNOSIS — S62636A Displaced fracture of distal phalanx of right little finger, initial encounter for closed fracture: Secondary | ICD-10-CM | POA: Diagnosis not present

## 2020-02-09 DIAGNOSIS — M9903 Segmental and somatic dysfunction of lumbar region: Secondary | ICD-10-CM | POA: Diagnosis not present

## 2020-02-09 DIAGNOSIS — M9901 Segmental and somatic dysfunction of cervical region: Secondary | ICD-10-CM | POA: Diagnosis not present

## 2020-02-09 DIAGNOSIS — M9902 Segmental and somatic dysfunction of thoracic region: Secondary | ICD-10-CM | POA: Diagnosis not present

## 2020-02-09 DIAGNOSIS — M5412 Radiculopathy, cervical region: Secondary | ICD-10-CM | POA: Diagnosis not present

## 2020-02-23 DIAGNOSIS — M9903 Segmental and somatic dysfunction of lumbar region: Secondary | ICD-10-CM | POA: Diagnosis not present

## 2020-02-23 DIAGNOSIS — M9901 Segmental and somatic dysfunction of cervical region: Secondary | ICD-10-CM | POA: Diagnosis not present

## 2020-02-23 DIAGNOSIS — M9902 Segmental and somatic dysfunction of thoracic region: Secondary | ICD-10-CM | POA: Diagnosis not present

## 2020-02-23 DIAGNOSIS — M5412 Radiculopathy, cervical region: Secondary | ICD-10-CM | POA: Diagnosis not present

## 2020-02-24 ENCOUNTER — Other Ambulatory Visit: Payer: Self-pay

## 2020-02-24 DIAGNOSIS — E119 Type 2 diabetes mellitus without complications: Secondary | ICD-10-CM

## 2020-02-24 DIAGNOSIS — Z794 Long term (current) use of insulin: Secondary | ICD-10-CM

## 2020-02-24 NOTE — Addendum Note (Signed)
Addended by: Christianne Dolin F on: 02/24/2020 09:46 AM   Modules accepted: Orders

## 2020-02-25 LAB — MICROALBUMIN / CREATININE URINE RATIO

## 2020-02-25 LAB — CMP12+LP+TP+TSH+6AC+PSA+CBC…
ALT: 14 IU/L (ref 0–44)
AST: 12 IU/L (ref 0–40)
Albumin/Globulin Ratio: 2.2 (ref 1.2–2.2)
Albumin: 4 g/dL (ref 3.8–4.9)
Alkaline Phosphatase: 49 IU/L (ref 44–121)
BUN/Creatinine Ratio: 23 — ABNORMAL HIGH (ref 9–20)
BUN: 17 mg/dL (ref 6–24)
Basophils Absolute: 0 10*3/uL (ref 0.0–0.2)
Basos: 1 %
Bilirubin Total: 0.3 mg/dL (ref 0.0–1.2)
Calcium: 9.2 mg/dL (ref 8.7–10.2)
Chloride: 106 mmol/L (ref 96–106)
Chol/HDL Ratio: 2.7 ratio (ref 0.0–5.0)
Cholesterol, Total: 192 mg/dL (ref 100–199)
Creatinine, Ser: 0.74 mg/dL — ABNORMAL LOW (ref 0.76–1.27)
EOS (ABSOLUTE): 0.2 10*3/uL (ref 0.0–0.4)
Eos: 3 %
Estimated CHD Risk: <0.5 times avg. (ref 0.0–1.0)
Free Thyroxine Index: 2.2 (ref 1.2–4.9)
GFR calc Af Amer: 123 mL/min/{1.73_m2} (ref >59)
GFR calc non Af Amer: 107 mL/min/{1.73_m2} (ref >59)
GGT: 8 IU/L (ref 0–65)
Globulin, Total: 1.8 g/dL (ref 1.5–4.5)
Glucose: 184 mg/dL — ABNORMAL HIGH (ref 65–99)
HDL: 71 mg/dL (ref >39)
Hematocrit: 43.5 % (ref 37.5–51.0)
Hemoglobin: 14.6 g/dL (ref 13.0–17.7)
Immature Grans (Abs): 0 10*3/uL (ref 0.0–0.1)
Immature Granulocytes: 1 %
Iron: 77 ug/dL (ref 38–169)
LDH: 116 IU/L — ABNORMAL LOW (ref 121–224)
LDL Chol Calc (NIH): 113 mg/dL — ABNORMAL HIGH (ref 0–99)
Lymphocytes Absolute: 1.7 10*3/uL (ref 0.7–3.1)
Lymphs: 32 %
MCH: 30 pg (ref 26.6–33.0)
MCHC: 33.6 g/dL (ref 31.5–35.7)
MCV: 89 fL (ref 79–97)
Monocytes Absolute: 0.4 10*3/uL (ref 0.1–0.9)
Monocytes: 8 %
Neutrophils Absolute: 2.9 10*3/uL (ref 1.4–7.0)
Neutrophils: 55 %
Phosphorus: 3 mg/dL (ref 2.8–4.1)
Platelets: 279 10*3/uL (ref 150–450)
Potassium: 4.1 mmol/L (ref 3.5–5.2)
Prostate Specific Ag, Serum: 0.6 ng/mL (ref 0.0–4.0)
RBC: 4.87 x10E6/uL (ref 4.14–5.80)
RDW: 13 % (ref 11.6–15.4)
Sodium: 141 mmol/L (ref 134–144)
T3 Uptake Ratio: 28 % (ref 24–39)
T4, Total: 7.8 ug/dL (ref 4.5–12.0)
TSH: 4.72 u[IU]/mL — ABNORMAL HIGH (ref 0.450–4.500)
Total Protein: 5.8 g/dL — ABNORMAL LOW (ref 6.0–8.5)
Triglycerides: 38 mg/dL (ref 0–149)
Uric Acid: 3.6 mg/dL — ABNORMAL LOW (ref 3.8–8.4)
VLDL Cholesterol Cal: 8 mg/dL (ref 5–40)
WBC: 5.2 10*3/uL (ref 3.4–10.8)

## 2020-02-25 LAB — BUN+CREAT

## 2020-02-25 LAB — HGB A1C W/O EAG: Hgb A1c MFr Bld: 5.7 % — ABNORMAL HIGH (ref 4.8–5.6)

## 2020-03-02 ENCOUNTER — Other Ambulatory Visit: Payer: Self-pay

## 2020-03-02 ENCOUNTER — Ambulatory Visit: Payer: Self-pay | Admitting: Adult Medicine

## 2020-03-02 VITALS — BP 128/86 | HR 65 | Temp 98.7°F | Resp 14 | Ht 74.0 in | Wt 182.0 lb

## 2020-03-02 DIAGNOSIS — E109 Type 1 diabetes mellitus without complications: Secondary | ICD-10-CM

## 2020-03-02 DIAGNOSIS — Z1211 Encounter for screening for malignant neoplasm of colon: Secondary | ICD-10-CM

## 2020-03-02 DIAGNOSIS — E038 Other specified hypothyroidism: Secondary | ICD-10-CM

## 2020-03-02 DIAGNOSIS — E119 Type 2 diabetes mellitus without complications: Secondary | ICD-10-CM

## 2020-03-02 DIAGNOSIS — Z794 Long term (current) use of insulin: Secondary | ICD-10-CM

## 2020-03-02 MED ORDER — BASAGLAR KWIKPEN 100 UNIT/ML ~~LOC~~ SOPN: 15.0000 [IU] | PEN_INJECTOR | Freq: Every day | SUBCUTANEOUS | 3 refills | Status: DC

## 2020-03-02 MED ORDER — BD PEN NEEDLE NANO 2ND GEN 32G X 4 MM MISC
2 refills | Status: DC
Start: 2020-03-02 — End: 2022-09-12

## 2020-03-02 NOTE — Patient Instructions (Signed)
Mild athenia likely sacropenic. Discussed to Increase protein intake through day. May use protein drink BCAA, Whey

## 2020-03-02 NOTE — Progress Notes (Signed)
   Subjective:    Patient ID: Paul Jacobs, male    DOB: 12/22/1970, 50 y.o.   MRN: [id number]  HPI 14y male 63yr 2y hx niddm then iddm  endocinologist prescribed metformin, glargine returns for 71m lab review good progress Hba1c5.7 patient reports gets hungry @nite , late pm snacking prior to last fbs testing 184. Hdl71 nl tchol    Review of Systems Denies weakness, fatigue, vision or sensory changes     Objective:   Physical Exam Focus exam normotensive athenic male in NAD works full time Heent perrla eom full light reflex intact Pulm clear A/P Cardiac nl pmi no m/r/ectopy Abd soft BS no organomegaly Extremity nl proprioception to pinwheel   2pt discrimination, no varicosity, edema       Assessment & Plan:  IDDM Stable progress of Hba1c  home BS log to be review. Reports BS sensor to fingerstick Reading differential ~10-20  Hypothyroid Discussed need to follow up lab thyroid antibodies panel to be drawn today F/u 2wks

## 2020-03-02 NOTE — Progress Notes (Signed)
Thyroid antibody Thyroid peroxidase

## 2020-03-03 LAB — THYROID ANTIBODIES
Thyroglobulin Antibody: <1 IU/mL (ref 0.0–0.9)
Thyroperoxidase Ab SerPl-aCnc: <8 IU/mL (ref 0–34)

## 2020-03-08 DIAGNOSIS — M9901 Segmental and somatic dysfunction of cervical region: Secondary | ICD-10-CM | POA: Diagnosis not present

## 2020-03-08 DIAGNOSIS — M9902 Segmental and somatic dysfunction of thoracic region: Secondary | ICD-10-CM | POA: Diagnosis not present

## 2020-03-08 DIAGNOSIS — M9903 Segmental and somatic dysfunction of lumbar region: Secondary | ICD-10-CM | POA: Diagnosis not present

## 2020-03-08 DIAGNOSIS — M5412 Radiculopathy, cervical region: Secondary | ICD-10-CM | POA: Diagnosis not present

## 2020-03-28 DIAGNOSIS — M9902 Segmental and somatic dysfunction of thoracic region: Secondary | ICD-10-CM | POA: Diagnosis not present

## 2020-03-28 DIAGNOSIS — M9901 Segmental and somatic dysfunction of cervical region: Secondary | ICD-10-CM | POA: Diagnosis not present

## 2020-03-28 DIAGNOSIS — M9903 Segmental and somatic dysfunction of lumbar region: Secondary | ICD-10-CM | POA: Diagnosis not present

## 2020-03-28 DIAGNOSIS — M5412 Radiculopathy, cervical region: Secondary | ICD-10-CM | POA: Diagnosis not present

## 2020-04-03 ENCOUNTER — Other Ambulatory Visit: Payer: Self-pay | Admitting: Registered Nurse

## 2020-04-03 DIAGNOSIS — E109 Type 1 diabetes mellitus without complications: Secondary | ICD-10-CM

## 2020-04-04 ENCOUNTER — Other Ambulatory Visit: Payer: Self-pay

## 2020-04-04 NOTE — Telephone Encounter (Signed)
Rx refill request already in Epic that was sent to Dr. Pricilla Handler.  Opened up the request.  Completed 90 days with 1 refill.  Last physical 09/02/19 with Durward Parcel, PA-C.  AMD

## 2020-04-04 NOTE — Telephone Encounter (Signed)
Refill came to [employer]/Tina Betancourt NP for some reason. Thanks!

## 2020-04-04 NOTE — Telephone Encounter (Signed)
Responding Refill DM med done

## 2020-04-09 DIAGNOSIS — M9901 Segmental and somatic dysfunction of cervical region: Secondary | ICD-10-CM | POA: Diagnosis not present

## 2020-04-09 DIAGNOSIS — M9903 Segmental and somatic dysfunction of lumbar region: Secondary | ICD-10-CM | POA: Diagnosis not present

## 2020-04-09 DIAGNOSIS — M5412 Radiculopathy, cervical region: Secondary | ICD-10-CM | POA: Diagnosis not present

## 2020-04-09 DIAGNOSIS — M9902 Segmental and somatic dysfunction of thoracic region: Secondary | ICD-10-CM | POA: Diagnosis not present

## 2020-04-18 DIAGNOSIS — M9903 Segmental and somatic dysfunction of lumbar region: Secondary | ICD-10-CM | POA: Diagnosis not present

## 2020-04-18 DIAGNOSIS — M5412 Radiculopathy, cervical region: Secondary | ICD-10-CM | POA: Diagnosis not present

## 2020-04-18 DIAGNOSIS — M9902 Segmental and somatic dysfunction of thoracic region: Secondary | ICD-10-CM | POA: Diagnosis not present

## 2020-04-18 DIAGNOSIS — M9901 Segmental and somatic dysfunction of cervical region: Secondary | ICD-10-CM | POA: Diagnosis not present

## 2020-04-22 DIAGNOSIS — M9901 Segmental and somatic dysfunction of cervical region: Secondary | ICD-10-CM | POA: Diagnosis not present

## 2020-04-22 DIAGNOSIS — M5412 Radiculopathy, cervical region: Secondary | ICD-10-CM | POA: Diagnosis not present

## 2020-04-22 DIAGNOSIS — M9902 Segmental and somatic dysfunction of thoracic region: Secondary | ICD-10-CM | POA: Diagnosis not present

## 2020-04-22 DIAGNOSIS — M9903 Segmental and somatic dysfunction of lumbar region: Secondary | ICD-10-CM | POA: Diagnosis not present

## 2020-05-06 DIAGNOSIS — M9901 Segmental and somatic dysfunction of cervical region: Secondary | ICD-10-CM | POA: Diagnosis not present

## 2020-05-06 DIAGNOSIS — M9902 Segmental and somatic dysfunction of thoracic region: Secondary | ICD-10-CM | POA: Diagnosis not present

## 2020-05-06 DIAGNOSIS — M9903 Segmental and somatic dysfunction of lumbar region: Secondary | ICD-10-CM | POA: Diagnosis not present

## 2020-05-06 DIAGNOSIS — M5412 Radiculopathy, cervical region: Secondary | ICD-10-CM | POA: Diagnosis not present

## 2020-05-08 ENCOUNTER — Other Ambulatory Visit: Payer: Self-pay | Admitting: Physician Assistant

## 2020-05-08 DIAGNOSIS — E119 Type 2 diabetes mellitus without complications: Secondary | ICD-10-CM

## 2020-05-08 DIAGNOSIS — Z794 Long term (current) use of insulin: Secondary | ICD-10-CM

## 2020-05-13 ENCOUNTER — Telehealth: Payer: Self-pay

## 2020-05-13 NOTE — Telephone Encounter (Signed)
Rx refill request already in Epic - re-routed to [name] Smith, PA-C.  AMD 

## 2020-05-20 DIAGNOSIS — M9902 Segmental and somatic dysfunction of thoracic region: Secondary | ICD-10-CM | POA: Diagnosis not present

## 2020-05-20 DIAGNOSIS — M9901 Segmental and somatic dysfunction of cervical region: Secondary | ICD-10-CM | POA: Diagnosis not present

## 2020-05-20 DIAGNOSIS — M5412 Radiculopathy, cervical region: Secondary | ICD-10-CM | POA: Diagnosis not present

## 2020-05-20 DIAGNOSIS — M9903 Segmental and somatic dysfunction of lumbar region: Secondary | ICD-10-CM | POA: Diagnosis not present

## 2020-06-09 DIAGNOSIS — H40033 Anatomical narrow angle, bilateral: Secondary | ICD-10-CM | POA: Diagnosis not present

## 2020-06-09 DIAGNOSIS — E119 Type 2 diabetes mellitus without complications: Secondary | ICD-10-CM | POA: Diagnosis not present

## 2020-06-22 DIAGNOSIS — E109 Type 1 diabetes mellitus without complications: Secondary | ICD-10-CM | POA: Diagnosis not present

## 2020-06-22 DIAGNOSIS — L600 Ingrowing nail: Secondary | ICD-10-CM | POA: Diagnosis not present

## 2020-06-22 DIAGNOSIS — B351 Tinea unguium: Secondary | ICD-10-CM | POA: Diagnosis not present

## 2020-07-13 DIAGNOSIS — Z872 Personal history of diseases of the skin and subcutaneous tissue: Secondary | ICD-10-CM | POA: Diagnosis not present

## 2020-07-13 DIAGNOSIS — L578 Other skin changes due to chronic exposure to nonionizing radiation: Secondary | ICD-10-CM | POA: Diagnosis not present

## 2020-07-13 DIAGNOSIS — L821 Other seborrheic keratosis: Secondary | ICD-10-CM | POA: Diagnosis not present

## 2020-07-13 DIAGNOSIS — L570 Actinic keratosis: Secondary | ICD-10-CM | POA: Diagnosis not present

## 2020-08-11 ENCOUNTER — Other Ambulatory Visit: Payer: Self-pay | Admitting: Physician Assistant

## 2020-08-11 DIAGNOSIS — Z794 Long term (current) use of insulin: Secondary | ICD-10-CM

## 2020-08-11 DIAGNOSIS — E119 Type 2 diabetes mellitus without complications: Secondary | ICD-10-CM

## 2020-08-23 ENCOUNTER — Ambulatory Visit: Payer: Self-pay

## 2020-08-23 ENCOUNTER — Other Ambulatory Visit: Payer: Self-pay

## 2020-08-23 DIAGNOSIS — Z0000 Encounter for general adult medical examination without abnormal findings: Secondary | ICD-10-CM

## 2020-08-23 LAB — POCT URINALYSIS DIPSTICK
Bilirubin, UA: NEGATIVE
Blood, UA: POSITIVE
Glucose, UA: NEGATIVE
Ketones, UA: NEGATIVE
Leukocytes, UA: NEGATIVE
Nitrite, UA: NEGATIVE
Protein, UA: NEGATIVE
Spec Grav, UA: 1.02 (ref 1.010–1.025)
Urobilinogen, UA: 0.2 E.U./dL
pH, UA: 6 (ref 5.0–8.0)

## 2020-08-23 NOTE — Progress Notes (Signed)
Pt scheduled to complete annual phsycial with [name] smith,PA-C 08/25/20. CL,RMA

## 2020-08-24 LAB — CMP12+LP+TP+TSH+6AC+PSA+CBC…
ALT: 16 IU/L (ref 0–44)
AST: 14 IU/L (ref 0–40)
Albumin/Globulin Ratio: 2.4 — ABNORMAL HIGH (ref 1.2–2.2)
Albumin: 4.5 g/dL (ref 3.8–4.9)
Alkaline Phosphatase: 50 IU/L (ref 44–121)
BUN/Creatinine Ratio: 24 — ABNORMAL HIGH (ref 9–20)
BUN: 21 mg/dL (ref 6–24)
Basophils Absolute: 0 10*3/uL (ref 0.0–0.2)
Basos: 1 %
Bilirubin Total: 0.4 mg/dL (ref 0.0–1.2)
Calcium: 9.4 mg/dL (ref 8.7–10.2)
Chloride: 103 mmol/L (ref 96–106)
Chol/HDL Ratio: 2.4 ratio (ref 0.0–5.0)
Cholesterol, Total: 168 mg/dL (ref 100–199)
Creatinine, Ser: 0.87 mg/dL (ref 0.76–1.27)
EOS (ABSOLUTE): 0.3 10*3/uL (ref 0.0–0.4)
Eos: 6 %
Estimated CHD Risk: <0.5 times avg. (ref 0.0–1.0)
Free Thyroxine Index: 2 (ref 1.2–4.9)
GGT: 9 IU/L (ref 0–65)
Globulin, Total: 1.9 g/dL (ref 1.5–4.5)
Glucose: 80 mg/dL (ref 65–99)
HDL: 69 mg/dL (ref >39)
Hematocrit: 44.6 % (ref 37.5–51.0)
Hemoglobin: 14.9 g/dL (ref 13.0–17.7)
Immature Grans (Abs): 0 10*3/uL (ref 0.0–0.1)
Immature Granulocytes: 0 %
Iron: 107 ug/dL (ref 38–169)
LDH: 132 IU/L (ref 121–224)
LDL Chol Calc (NIH): 89 mg/dL (ref 0–99)
Lymphocytes Absolute: 1.6 10*3/uL (ref 0.7–3.1)
Lymphs: 28 %
MCH: 30 pg (ref 26.6–33.0)
MCHC: 33.4 g/dL (ref 31.5–35.7)
MCV: 90 fL (ref 79–97)
Monocytes Absolute: 0.4 10*3/uL (ref 0.1–0.9)
Monocytes: 8 %
Neutrophils Absolute: 3.3 10*3/uL (ref 1.4–7.0)
Neutrophils: 57 %
Phosphorus: 3.9 mg/dL (ref 2.8–4.1)
Platelets: 266 10*3/uL (ref 150–450)
Potassium: 4.4 mmol/L (ref 3.5–5.2)
Prostate Specific Ag, Serum: 0.8 ng/mL (ref 0.0–4.0)
RBC: 4.97 x10E6/uL (ref 4.14–5.80)
RDW: 12.7 % (ref 11.6–15.4)
Sodium: 137 mmol/L (ref 134–144)
T3 Uptake Ratio: 24 % (ref 24–39)
T4, Total: 8.2 ug/dL (ref 4.5–12.0)
TSH: 4.8 u[IU]/mL — ABNORMAL HIGH (ref 0.450–4.500)
Total Protein: 6.4 g/dL (ref 6.0–8.5)
Triglycerides: 48 mg/dL (ref 0–149)
Uric Acid: 4.8 mg/dL (ref 3.8–8.4)
VLDL Cholesterol Cal: 10 mg/dL (ref 5–40)
WBC: 5.8 10*3/uL (ref 3.4–10.8)
eGFR: 104 mL/min/{1.73_m2} (ref >59)

## 2020-08-24 LAB — MICROALBUMIN / CREATININE URINE RATIO
Creatinine, Urine: 94.5 mg/dL
Microalb/Creat Ratio: 4 mg/g creat (ref 0–29)
Microalbumin, Urine: 3.9 ug/mL

## 2020-08-24 LAB — HGB A1C W/O EAG: Hgb A1c MFr Bld: 6.1 % — ABNORMAL HIGH (ref 4.8–5.6)

## 2020-08-25 ENCOUNTER — Encounter: Payer: Self-pay | Admitting: Physician Assistant

## 2020-08-25 ENCOUNTER — Other Ambulatory Visit: Payer: Self-pay

## 2020-08-25 ENCOUNTER — Ambulatory Visit: Payer: Self-pay | Admitting: Physician Assistant

## 2020-08-25 VITALS — BP 125/83 | HR 80 | Temp 97.8°F | Resp 14 | Ht 74.0 in | Wt 174.0 lb

## 2020-08-25 DIAGNOSIS — Z0000 Encounter for general adult medical examination without abnormal findings: Secondary | ICD-10-CM

## 2020-08-25 DIAGNOSIS — Z1211 Encounter for screening for malignant neoplasm of colon: Secondary | ICD-10-CM

## 2020-08-25 MED ORDER — LEVOTHYROXINE SODIUM 125 MCG PO TABS: 125.0000 ug | ORAL_TABLET | Freq: Every day | ORAL | 3 refills | Status: DC

## 2020-08-25 NOTE — Addendum Note (Signed)
Addended by: Gardner Candle on: 08/25/2020 02:03 PM   Modules accepted: Orders

## 2020-08-25 NOTE — Progress Notes (Signed)
   Subjective: Annual physical exam    Patient ID: Paul Jacobs, male    DOB: 11-15-1970, 50 y.o.   MRN: [id number]  HPI Patient presents for annual physical exam.  Requests shot for shingles.  Request consult for colonoscopy.   Review of Systems Diabetes, erectile dysfunction, and hypothyroidism.    Objective:   Physical Exam No acute distress.  Temperature 97.8, pulse 80, respiration 14, BP is 125/83, patient 90% O2 sat on room air.  Patient was on 74 pounds.  BMI is 22.3. No acute distress.  HEENT is unremarkable.  Neck is supple without adenopathy or bruits.  Lungs are clear to auscultation.  Heart is regular rate and rhythm.  No acute findings on EKG. Abdomen negative HSM, normoactive bowel sounds, soft, nontender to palpation. No obvious deformity to the upper or lower extremities.  Patient has full and equal range of motion of the upper and lower extremities. No obvious deformity to cervical lumbar spine.  Patient has full and equal range of motion cervical lumbar spine. Patient normal sensation diabetic foot test. Cranial nerves II through XII grossly intact.  DTR 2+ without clonus.       Assessment & Plan: Well exam.  Reviewed lab results with patient showing elevation of TSH.  We will increase levothyroxine from 50 mcg to 125 mcg.  Patient will follow-up in 3 months to have levels rechecked.  Patient advised to have shingles shot done at local pharmacy.  Consult for colonoscopy generated today.

## 2020-08-25 NOTE — Progress Notes (Signed)
Education provided to patient today regarding importance of recommended colonoscopy screening. Patient wishes to discuss further with provider. [name] Katrinka Blazing PA-C agrees to follow up with patient on colonoscopy screening.

## 2020-08-29 DIAGNOSIS — M5412 Radiculopathy, cervical region: Secondary | ICD-10-CM | POA: Diagnosis not present

## 2020-08-29 DIAGNOSIS — M9902 Segmental and somatic dysfunction of thoracic region: Secondary | ICD-10-CM | POA: Diagnosis not present

## 2020-08-29 DIAGNOSIS — M9901 Segmental and somatic dysfunction of cervical region: Secondary | ICD-10-CM | POA: Diagnosis not present

## 2020-08-29 DIAGNOSIS — M9903 Segmental and somatic dysfunction of lumbar region: Secondary | ICD-10-CM | POA: Diagnosis not present

## 2020-09-11 ENCOUNTER — Encounter: Payer: Self-pay | Admitting: Physician Assistant

## 2020-09-11 ENCOUNTER — Other Ambulatory Visit: Payer: Self-pay | Admitting: Physician Assistant

## 2020-09-11 DIAGNOSIS — E109 Type 1 diabetes mellitus without complications: Secondary | ICD-10-CM

## 2020-09-12 ENCOUNTER — Telehealth: Payer: Self-pay

## 2020-09-12 DIAGNOSIS — M5412 Radiculopathy, cervical region: Secondary | ICD-10-CM | POA: Diagnosis not present

## 2020-09-12 DIAGNOSIS — M9901 Segmental and somatic dysfunction of cervical region: Secondary | ICD-10-CM | POA: Diagnosis not present

## 2020-09-12 DIAGNOSIS — M9902 Segmental and somatic dysfunction of thoracic region: Secondary | ICD-10-CM | POA: Diagnosis not present

## 2020-09-12 DIAGNOSIS — M9903 Segmental and somatic dysfunction of lumbar region: Secondary | ICD-10-CM | POA: Diagnosis not present

## 2020-09-12 NOTE — Telephone Encounter (Signed)
When I put the refill in for Novolog, a box opened up that there was already an electronic request in Epic from patient's pharmacy. Re-routed it to [employer], PA-C.  AMD

## 2020-09-21 DIAGNOSIS — E119 Type 2 diabetes mellitus without complications: Secondary | ICD-10-CM | POA: Diagnosis not present

## 2020-09-26 DIAGNOSIS — M5412 Radiculopathy, cervical region: Secondary | ICD-10-CM | POA: Diagnosis not present

## 2020-09-26 DIAGNOSIS — M9901 Segmental and somatic dysfunction of cervical region: Secondary | ICD-10-CM | POA: Diagnosis not present

## 2020-09-26 DIAGNOSIS — M9903 Segmental and somatic dysfunction of lumbar region: Secondary | ICD-10-CM | POA: Diagnosis not present

## 2020-09-26 DIAGNOSIS — M9902 Segmental and somatic dysfunction of thoracic region: Secondary | ICD-10-CM | POA: Diagnosis not present

## 2020-10-12 DIAGNOSIS — M9901 Segmental and somatic dysfunction of cervical region: Secondary | ICD-10-CM | POA: Diagnosis not present

## 2020-10-12 DIAGNOSIS — M9902 Segmental and somatic dysfunction of thoracic region: Secondary | ICD-10-CM | POA: Diagnosis not present

## 2020-10-12 DIAGNOSIS — M9903 Segmental and somatic dysfunction of lumbar region: Secondary | ICD-10-CM | POA: Diagnosis not present

## 2020-10-12 DIAGNOSIS — M5412 Radiculopathy, cervical region: Secondary | ICD-10-CM | POA: Diagnosis not present

## 2020-10-25 ENCOUNTER — Other Ambulatory Visit: Payer: Self-pay

## 2020-10-25 DIAGNOSIS — E109 Type 1 diabetes mellitus without complications: Secondary | ICD-10-CM

## 2020-10-25 MED ORDER — FREESTYLE LITE TEST VI STRP
ORAL_STRIP | 12 refills | Status: DC
Start: 2020-10-25 — End: 2021-09-11

## 2020-10-26 DIAGNOSIS — M5412 Radiculopathy, cervical region: Secondary | ICD-10-CM | POA: Diagnosis not present

## 2020-10-26 DIAGNOSIS — M9901 Segmental and somatic dysfunction of cervical region: Secondary | ICD-10-CM | POA: Diagnosis not present

## 2020-10-26 DIAGNOSIS — M9902 Segmental and somatic dysfunction of thoracic region: Secondary | ICD-10-CM | POA: Diagnosis not present

## 2020-10-26 DIAGNOSIS — M9903 Segmental and somatic dysfunction of lumbar region: Secondary | ICD-10-CM | POA: Diagnosis not present

## 2020-10-31 ENCOUNTER — Other Ambulatory Visit: Payer: Self-pay | Admitting: Physician Assistant

## 2020-10-31 DIAGNOSIS — E039 Hypothyroidism, unspecified: Secondary | ICD-10-CM

## 2020-11-04 DIAGNOSIS — E119 Type 2 diabetes mellitus without complications: Secondary | ICD-10-CM | POA: Diagnosis not present

## 2020-11-04 DIAGNOSIS — Z794 Long term (current) use of insulin: Secondary | ICD-10-CM | POA: Diagnosis not present

## 2020-11-04 DIAGNOSIS — Z1211 Encounter for screening for malignant neoplasm of colon: Secondary | ICD-10-CM | POA: Diagnosis not present

## 2020-11-07 DIAGNOSIS — M9902 Segmental and somatic dysfunction of thoracic region: Secondary | ICD-10-CM | POA: Diagnosis not present

## 2020-11-07 DIAGNOSIS — M9901 Segmental and somatic dysfunction of cervical region: Secondary | ICD-10-CM | POA: Diagnosis not present

## 2020-11-07 DIAGNOSIS — M9903 Segmental and somatic dysfunction of lumbar region: Secondary | ICD-10-CM | POA: Diagnosis not present

## 2020-11-07 DIAGNOSIS — M5412 Radiculopathy, cervical region: Secondary | ICD-10-CM | POA: Diagnosis not present

## 2020-11-23 ENCOUNTER — Other Ambulatory Visit: Payer: Self-pay

## 2020-11-23 ENCOUNTER — Other Ambulatory Visit: Payer: Self-pay | Admitting: Physician Assistant

## 2020-11-23 ENCOUNTER — Ambulatory Visit: Payer: Self-pay | Admitting: Physician Assistant

## 2020-11-23 ENCOUNTER — Encounter: Payer: Self-pay | Admitting: Physician Assistant

## 2020-11-23 DIAGNOSIS — Z79899 Other long term (current) drug therapy: Secondary | ICD-10-CM

## 2020-11-23 DIAGNOSIS — Z23 Encounter for immunization: Secondary | ICD-10-CM

## 2020-11-23 MED ORDER — GVOKE HYPOPEN 1-PACK 1 MG/0.2ML ~~LOC~~ SOAJ: 1.0000 mg | Freq: Once | SUBCUTANEOUS | 0 refills | Status: AC

## 2020-11-23 MED ORDER — GLUCAGON (RDNA) 1 MG IJ KIT: 1.0000 mg | PACK | INTRAMUSCULAR | 0 refills | Status: DC | PRN

## 2020-11-23 NOTE — Progress Notes (Signed)
   Subjective:    Patient ID: Paul Jacobs, male    DOB: September 23, 1970, 50 y.o.   MRN: [id number]  HPI Patient requests clarification on his colonoscopy procedure.  Patient voices confusion on directions prior to the procedure.  Patient also requesting refill of his Glucagon injection kit.   Review of Systems Diabetes, hyperlipidemia, and hypothyroidism.    Objective:   Physical Exam  Deferred      Assessment & Plan: Medication clarification   Reviewed preprocedure instructions with patient.  Patient glucagon injection kit refilled.  Follow-up as necessary.

## 2020-11-23 NOTE — Progress Notes (Signed)
Pt is having a colonoscopy and was told to stop his insulin or take half a dose the day of prep./CL,RMA  Pt requesting new prescription for glucagon injection.

## 2020-11-24 ENCOUNTER — Encounter: Payer: Self-pay | Admitting: Physician Assistant

## 2020-11-28 DIAGNOSIS — M9903 Segmental and somatic dysfunction of lumbar region: Secondary | ICD-10-CM | POA: Diagnosis not present

## 2020-11-28 DIAGNOSIS — M9901 Segmental and somatic dysfunction of cervical region: Secondary | ICD-10-CM | POA: Diagnosis not present

## 2020-11-28 DIAGNOSIS — M9902 Segmental and somatic dysfunction of thoracic region: Secondary | ICD-10-CM | POA: Diagnosis not present

## 2020-11-28 DIAGNOSIS — M5412 Radiculopathy, cervical region: Secondary | ICD-10-CM | POA: Diagnosis not present

## 2020-12-02 ENCOUNTER — Other Ambulatory Visit: Payer: Self-pay

## 2020-12-05 ENCOUNTER — Other Ambulatory Visit: Payer: Self-pay

## 2020-12-05 DIAGNOSIS — N529 Male erectile dysfunction, unspecified: Secondary | ICD-10-CM

## 2020-12-05 MED ORDER — SILDENAFIL CITRATE 20 MG PO TABS: ORAL_TABLET | ORAL | 2 refills | Status: DC

## 2020-12-12 DIAGNOSIS — M9901 Segmental and somatic dysfunction of cervical region: Secondary | ICD-10-CM | POA: Diagnosis not present

## 2020-12-12 DIAGNOSIS — M9903 Segmental and somatic dysfunction of lumbar region: Secondary | ICD-10-CM | POA: Diagnosis not present

## 2020-12-12 DIAGNOSIS — M5412 Radiculopathy, cervical region: Secondary | ICD-10-CM | POA: Diagnosis not present

## 2020-12-12 DIAGNOSIS — M9902 Segmental and somatic dysfunction of thoracic region: Secondary | ICD-10-CM | POA: Diagnosis not present

## 2020-12-13 ENCOUNTER — Encounter: Payer: Self-pay | Admitting: Physician Assistant

## 2020-12-13 ENCOUNTER — Other Ambulatory Visit: Payer: Self-pay | Admitting: Physician Assistant

## 2020-12-13 MED ORDER — FREESTYLE LIBRE 2 SENSOR MISC: 1.0000 | 6 refills | Status: DC

## 2020-12-22 DIAGNOSIS — M9902 Segmental and somatic dysfunction of thoracic region: Secondary | ICD-10-CM | POA: Diagnosis not present

## 2020-12-22 DIAGNOSIS — M9901 Segmental and somatic dysfunction of cervical region: Secondary | ICD-10-CM | POA: Diagnosis not present

## 2020-12-22 DIAGNOSIS — M5412 Radiculopathy, cervical region: Secondary | ICD-10-CM | POA: Diagnosis not present

## 2020-12-22 DIAGNOSIS — M9903 Segmental and somatic dysfunction of lumbar region: Secondary | ICD-10-CM | POA: Diagnosis not present

## 2020-12-26 ENCOUNTER — Other Ambulatory Visit: Payer: Self-pay

## 2020-12-26 ENCOUNTER — Encounter: Payer: Self-pay | Admitting: Physician Assistant

## 2020-12-26 DIAGNOSIS — E109 Type 1 diabetes mellitus without complications: Secondary | ICD-10-CM

## 2020-12-26 DIAGNOSIS — M5412 Radiculopathy, cervical region: Secondary | ICD-10-CM | POA: Diagnosis not present

## 2020-12-26 DIAGNOSIS — M9901 Segmental and somatic dysfunction of cervical region: Secondary | ICD-10-CM | POA: Diagnosis not present

## 2020-12-26 DIAGNOSIS — M9902 Segmental and somatic dysfunction of thoracic region: Secondary | ICD-10-CM | POA: Diagnosis not present

## 2020-12-26 DIAGNOSIS — M9903 Segmental and somatic dysfunction of lumbar region: Secondary | ICD-10-CM | POA: Diagnosis not present

## 2020-12-26 MED ORDER — FREESTYLE LIBRE 2 READER DEVI: 0 refills | Status: DC

## 2020-12-27 DIAGNOSIS — M542 Cervicalgia: Secondary | ICD-10-CM | POA: Diagnosis not present

## 2020-12-27 DIAGNOSIS — M5030 Other cervical disc degeneration, unspecified cervical region: Secondary | ICD-10-CM | POA: Diagnosis not present

## 2020-12-30 DIAGNOSIS — M9902 Segmental and somatic dysfunction of thoracic region: Secondary | ICD-10-CM | POA: Diagnosis not present

## 2020-12-30 DIAGNOSIS — M5412 Radiculopathy, cervical region: Secondary | ICD-10-CM | POA: Diagnosis not present

## 2020-12-30 DIAGNOSIS — M9903 Segmental and somatic dysfunction of lumbar region: Secondary | ICD-10-CM | POA: Diagnosis not present

## 2020-12-30 DIAGNOSIS — M9901 Segmental and somatic dysfunction of cervical region: Secondary | ICD-10-CM | POA: Diagnosis not present

## 2021-01-10 ENCOUNTER — Other Ambulatory Visit: Payer: Self-pay | Admitting: Physician Assistant

## 2021-01-10 DIAGNOSIS — E109 Type 1 diabetes mellitus without complications: Secondary | ICD-10-CM

## 2021-01-16 DIAGNOSIS — M9903 Segmental and somatic dysfunction of lumbar region: Secondary | ICD-10-CM | POA: Diagnosis not present

## 2021-01-16 DIAGNOSIS — M9901 Segmental and somatic dysfunction of cervical region: Secondary | ICD-10-CM | POA: Diagnosis not present

## 2021-01-16 DIAGNOSIS — M5412 Radiculopathy, cervical region: Secondary | ICD-10-CM | POA: Diagnosis not present

## 2021-01-16 DIAGNOSIS — M9902 Segmental and somatic dysfunction of thoracic region: Secondary | ICD-10-CM | POA: Diagnosis not present

## 2021-01-30 DIAGNOSIS — M9903 Segmental and somatic dysfunction of lumbar region: Secondary | ICD-10-CM | POA: Diagnosis not present

## 2021-01-30 DIAGNOSIS — M9901 Segmental and somatic dysfunction of cervical region: Secondary | ICD-10-CM | POA: Diagnosis not present

## 2021-01-30 DIAGNOSIS — M9902 Segmental and somatic dysfunction of thoracic region: Secondary | ICD-10-CM | POA: Diagnosis not present

## 2021-01-30 DIAGNOSIS — M5412 Radiculopathy, cervical region: Secondary | ICD-10-CM | POA: Diagnosis not present

## 2021-02-13 ENCOUNTER — Other Ambulatory Visit: Payer: Self-pay | Admitting: Physician Assistant

## 2021-02-13 DIAGNOSIS — E109 Type 1 diabetes mellitus without complications: Secondary | ICD-10-CM

## 2021-02-14 DIAGNOSIS — M9901 Segmental and somatic dysfunction of cervical region: Secondary | ICD-10-CM | POA: Diagnosis not present

## 2021-02-14 DIAGNOSIS — M5412 Radiculopathy, cervical region: Secondary | ICD-10-CM | POA: Diagnosis not present

## 2021-02-14 DIAGNOSIS — M9902 Segmental and somatic dysfunction of thoracic region: Secondary | ICD-10-CM | POA: Diagnosis not present

## 2021-02-14 DIAGNOSIS — M9903 Segmental and somatic dysfunction of lumbar region: Secondary | ICD-10-CM | POA: Diagnosis not present

## 2021-02-27 DIAGNOSIS — M9902 Segmental and somatic dysfunction of thoracic region: Secondary | ICD-10-CM | POA: Diagnosis not present

## 2021-02-27 DIAGNOSIS — M9903 Segmental and somatic dysfunction of lumbar region: Secondary | ICD-10-CM | POA: Diagnosis not present

## 2021-02-27 DIAGNOSIS — M5412 Radiculopathy, cervical region: Secondary | ICD-10-CM | POA: Diagnosis not present

## 2021-02-27 DIAGNOSIS — M9901 Segmental and somatic dysfunction of cervical region: Secondary | ICD-10-CM | POA: Diagnosis not present

## 2021-03-02 ENCOUNTER — Other Ambulatory Visit: Payer: Self-pay

## 2021-03-02 DIAGNOSIS — Z0000 Encounter for general adult medical examination without abnormal findings: Secondary | ICD-10-CM

## 2021-03-02 NOTE — Progress Notes (Signed)
Pt presents today for 6 month labs. 

## 2021-03-03 LAB — CMP12+LP+TP+TSH+6AC+CBC/D/PLT
ALT: 20 IU/L (ref 0–44)
AST: 17 IU/L (ref 0–40)
Albumin/Globulin Ratio: 2.5 — ABNORMAL HIGH (ref 1.2–2.2)
Albumin: 4.2 g/dL (ref 3.8–4.9)
Alkaline Phosphatase: 63 IU/L (ref 44–121)
BUN/Creatinine Ratio: 20 (ref 9–20)
BUN: 14 mg/dL (ref 6–24)
Basophils Absolute: 0 10*3/uL (ref 0.0–0.2)
Basos: 1 %
Bilirubin Total: 0.4 mg/dL (ref 0.0–1.2)
Calcium: 9.5 mg/dL (ref 8.7–10.2)
Chloride: 103 mmol/L (ref 96–106)
Chol/HDL Ratio: 2.7 ratio (ref 0.0–5.0)
Cholesterol, Total: 168 mg/dL (ref 100–199)
Creatinine, Ser: 0.71 mg/dL — ABNORMAL LOW (ref 0.76–1.27)
EOS (ABSOLUTE): 0.1 10*3/uL (ref 0.0–0.4)
Eos: 2 %
Estimated CHD Risk: <0.5 times avg. (ref 0.0–1.0)
Free Thyroxine Index: 2.9 (ref 1.2–4.9)
GGT: 10 IU/L (ref 0–65)
Globulin, Total: 1.7 g/dL (ref 1.5–4.5)
Glucose: 114 mg/dL — ABNORMAL HIGH (ref 70–99)
HDL: 63 mg/dL (ref >39)
Hematocrit: 41.7 % (ref 37.5–51.0)
Hemoglobin: 15 g/dL (ref 13.0–17.7)
Immature Grans (Abs): 0 10*3/uL (ref 0.0–0.1)
Immature Granulocytes: 0 %
Iron: 111 ug/dL (ref 38–169)
LDH: 131 IU/L (ref 121–224)
LDL Chol Calc (NIH): 94 mg/dL (ref 0–99)
Lymphocytes Absolute: 1.6 10*3/uL (ref 0.7–3.1)
Lymphs: 30 %
MCH: 30.7 pg (ref 26.6–33.0)
MCHC: 36 g/dL — ABNORMAL HIGH (ref 31.5–35.7)
MCV: 85 fL (ref 79–97)
Monocytes Absolute: 0.5 10*3/uL (ref 0.1–0.9)
Monocytes: 9 %
Neutrophils Absolute: 3.2 10*3/uL (ref 1.4–7.0)
Neutrophils: 58 %
Phosphorus: 4.3 mg/dL — ABNORMAL HIGH (ref 2.8–4.1)
Platelets: 343 10*3/uL (ref 150–450)
Potassium: 4.3 mmol/L (ref 3.5–5.2)
RBC: 4.89 x10E6/uL (ref 4.14–5.80)
RDW: 12.9 % (ref 11.6–15.4)
Sodium: 140 mmol/L (ref 134–144)
T3 Uptake Ratio: 29 % (ref 24–39)
T4, Total: 10.1 ug/dL (ref 4.5–12.0)
TSH: 0.01 u[IU]/mL — ABNORMAL LOW (ref 0.450–4.500)
Total Protein: 5.9 g/dL — ABNORMAL LOW (ref 6.0–8.5)
Triglycerides: 52 mg/dL (ref 0–149)
Uric Acid: 4.8 mg/dL (ref 3.8–8.4)
VLDL Cholesterol Cal: 11 mg/dL (ref 5–40)
WBC: 5.5 10*3/uL (ref 3.4–10.8)
eGFR: 110 mL/min/{1.73_m2} (ref >59)

## 2021-03-03 LAB — HGB A1C W/O EAG: Hgb A1c MFr Bld: 5.4 % (ref 4.8–5.6)

## 2021-03-05 ENCOUNTER — Encounter: Payer: Self-pay | Admitting: Physician Assistant

## 2021-03-07 ENCOUNTER — Encounter: Payer: Self-pay | Admitting: Physician Assistant

## 2021-03-07 ENCOUNTER — Other Ambulatory Visit: Payer: Self-pay

## 2021-03-07 ENCOUNTER — Ambulatory Visit: Payer: Self-pay | Admitting: Physician Assistant

## 2021-03-07 VITALS — BP 133/80 | HR 60 | Temp 98.0°F | Resp 12 | Ht 74.0 in | Wt 180.0 lb

## 2021-03-07 DIAGNOSIS — E109 Type 1 diabetes mellitus without complications: Secondary | ICD-10-CM

## 2021-03-07 NOTE — Progress Notes (Signed)
Subjective: Diabetes    Patient ID: Paul Jacobs, male    DOB: 1970/07/18, 51 y.o.   MRN: [id number]  HPI Patient here for 81-monthfollow-up diabetes to discuss results of labs.  Patient states no other complaints.  Patient is scheduled for colonoscopy next week.   Review of Systems Diabetes and hyperlipidemia.     Objective:           Component Ref Range & Units 5 d ago (03/02/21) 6 mo ago (08/23/20) 1 yr ago (02/24/20) 1 yr ago (02/24/20) 1 yr ago (08/26/19) 1 yr ago (03/10/19) 2 yr ago (08/28/18)  Glucose 70 - 99 mg/dL 114 High   80 R  184 High  R   173 High  R   153 High  R   Uric Acid 3.8 - 8.4 mg/dL 4.8  4.8 CM  3.6 Low  CM   3.4 Low  CM   4.2 R, CM   Comment:            Therapeutic target for gout patients: <6.0  BUN 6 - 24 mg/dL _0 CANCELED R, CM  _1 Creatinine, Ser 0.76 - 1.27 mg/dL 0.71 Low   0.87  0.74 Low   CANCELED R, CM  0.78  0.73 Low   0.83   eGFR >59 mL/min/1.73 110  104        BUN/Creatinine Ratio 9 - _2 High   23 High    23 High   19  27 High    Sodium 134 - 144 mmol/L 140  137  141   140   139   Potassium 3.5 - 5.2 mmol/L 4.3  4.4  4.1   4.4   4.7   Chloride 96 - 106 mmol/L 103  103  106   106   104   Calcium 8.7 - 10.2 mg/dL 9.5  9.4  9.2   9.1   9.6   Phosphorus 2.8 - 4.1 mg/dL 4.3 High   3.9  3.0   3.2   3.5   Total Protein 6.0 - 8.5 g/dL 5.9 Low   6.4  5.8 Low    5.9 Low    6.3   Albumin 3.8 - 4.9 g/dL 4.2  4.5  4.0   3.7 Low  R   4.4 R   Globulin, Total 1.5 - 4.5 g/dL 1.7  1.9  1.8   2.2   1.9   Albumin/Globulin Ratio 1.2 - 2.2 2.5 High   2.4 High   2.2   1.7   2.3 High    Bilirubin Total 0.0 - 1.2 mg/dL 0.4  0.4  0.3   0.2   0.4   Alkaline Phosphatase 44 - 121 IU/L 63  50  49 CM   46 Low  R   48 R   LDH 121 - 224 IU/L 131  132  116 Low    120 Low    137   AST 0 - 40 IU/L _3 ALT 0 - 44 IU/L _4 GGT 0 - 65 IU/L _5 Iron 38 - 169 ug/dL 111  107  77   61   94   Cholesterol,  Total 100 - 199  mg/dL 168  168  192   151   175   Triglycerides 0 - 149 mg/dL 52  48  38   70   55   HDL >39 mg/dL 63  69  71   55   74   VLDL Cholesterol Cal 5 - 40 mg/dL _0 LDL Chol Calc (NIH) 0 - 99 mg/dL 94  89  113 High    82     Chol/HDL Ratio 0.0 - 5.0 ratio 2.7  2.4 CM  2.7 CM   2.7 CM   2.4 CM   Comment:                                   T. Chol/HDL Ratio                                              Men  Women                                1/2 Avg.Risk  3.4    3.3                                    Avg.Risk  5.0    4.4                                 2X Avg.Risk  9.6    7.1                                 3X Avg.Risk 23.4   11.0   Estimated CHD Risk 0.0 - 1.0 times avg.  < 0.5   < 0.5 CM   < 0.5 CM    < 0.5 CM    < 0.5 CM   Comment: The CHD Risk is based on the T. Chol/HDL ratio. Other  factors affect CHD Risk such as hypertension, smoking,  diabetes, severe obesity, and family history of  premature CHD.   TSH 0.450 - 4.500 uIU/mL 0.010 Low   4.800 High   4.720 High    4.020   4.100   T4, Total 4.5 - 12.0 ug/dL 10.1  8.2  7.8   7.6   8.7   T3 Uptake Ratio 24 - 39 % _1 Low    Free Thyroxine Index 1.2 - 4.9 2.9  2.0  2.2   2.1   2.0   WBC 3.4 - 10.8 x10E3/uL 5.5  5.8  5.2   5.7   5.2   RBC 4.14 - 5.80 x10E6/uL 4.89  4.97  4.87   4.88   4.97   Hemoglobin 13.0 - 17.7 g/dL 15.0  14.9  14.6   14.9   15.0   Hematocrit 37.5 - 51.0 % 41.7  44.6  43.5   44.3   42.8   MCV 79 - 97 fL 85  90  89   91  16   MCH 26.6 - 33.0 pg 30.7  30.0  30.0   30.5   30.2   MCHC 31.5 - 35.7 g/dL 36.0 High   33.4  33.6   33.6   35.0   RDW 11.6 - 15.4 % 12.9  12.7  13.0   12.7   12.4   Platelets 150 - 450 x10E3/uL 343  266  279   255   296   Neutrophils Not Estab. % 58  57  55   55   59   Lymphs Not Estab. % 30  28  32   32   29   Monocytes Not Estab. % _0 Eos Not Estab. % _1 Basos Not Estab. % _2 Neutrophils Absolute 1.4  - 7.0 x10E3/uL 3.2  3.3  2.9   3.2   3.1   Lymphocytes Absolute 0.7 - 3.1 x10E3/uL 1.6  1.6  1.7   1.8   1.5   Monocytes Absolute 0.1 - 0.9 x10E3/uL 0.5  0.4  0.4   0.4   0.4   EOS (ABSOLUTE) 0.0 - 0.4 x10E3/uL 0.1  0.3  0.2   0.3   0.2   Basophils Absolute 0.0 - 0.2 x10E3/uL 0.0  0.0  0.0   0.0   0.0   Immature Granulocytes Not Estab. % 0  0  1   0   0   Immature Grans (Abs) 0.0 - 0.1 x10E3/uL 0.0  0.0  0.0   0.0   0.0   Resulting Agency  _3  [facility]       Narrative Performed by: Maryan Puls Performed at:  [facility]  [address], [place], [place]  [id number]  Lab Director: Rush Farmer MD, Phone:  [phone]    Specimen Collected: 03/02/21 08:30 Last Resulted: 03/03/21 08:13      Lab Flowsheet    Order Details    View Encounter    Lab and Collection Details    Routing    Result History    View Encounter Conversation      CM=Additional comments  R=Reference range differs from displayed range      Result Care Coordination   Patient Communication   Add Comments   Seen Back to Top       Other Results from 03/02/2021  Hgb A1c w/o eAG Order: [id number] Status: Final result    Visible to patient: Yes (seen)    Next appt: None    Dx: Routine adult health maintenance    0 Result Notes   1 HM Topic           Component Ref Range & Units 5 d ago (03/02/21) 6 mo ago (08/23/20) 1 yr ago (02/24/20) 1 yr ago (08/26/19) 1 yr ago (06/03/19) 1 yr ago (03/10/19) 2 yr ago (08/28/18)  Hgb A1c MFr Bld 4.8 - 5.6 % 5.4  6.1 High  CM  5.7 High  CM  6.4 High  CM  5.7 Abnormal  R  6.0 High  CM  6.2 High  CM   Comment:          Prediabetes: 5.7 - 6.4           Diabetes: >6.4        Physical Exam No  acute distress.  See nurses notes and vital signs.  HEENT is unremarkable.  Neck is supple for lymphadenopathy or bruits.  Lungs are clear to auscultation.  Heart regular rate and rhythm.       Assessment & Plan: Diabetes  Discussed  lab results with patient showing good glucose control.  Continue previous medication follow-up as needed.

## 2021-03-07 NOTE — Progress Notes (Signed)
6 month follow up labs.

## 2021-03-09 ENCOUNTER — Encounter: Payer: Self-pay | Admitting: Physician Assistant

## 2021-03-10 ENCOUNTER — Encounter: Payer: Self-pay | Admitting: Gastroenterology

## 2021-03-14 DIAGNOSIS — M25512 Pain in left shoulder: Secondary | ICD-10-CM | POA: Diagnosis not present

## 2021-03-14 DIAGNOSIS — M19012 Primary osteoarthritis, left shoulder: Secondary | ICD-10-CM | POA: Diagnosis not present

## 2021-03-22 DIAGNOSIS — M9901 Segmental and somatic dysfunction of cervical region: Secondary | ICD-10-CM | POA: Diagnosis not present

## 2021-03-22 DIAGNOSIS — M9903 Segmental and somatic dysfunction of lumbar region: Secondary | ICD-10-CM | POA: Diagnosis not present

## 2021-03-22 DIAGNOSIS — M9902 Segmental and somatic dysfunction of thoracic region: Secondary | ICD-10-CM | POA: Diagnosis not present

## 2021-03-22 DIAGNOSIS — M5412 Radiculopathy, cervical region: Secondary | ICD-10-CM | POA: Diagnosis not present

## 2021-04-03 DIAGNOSIS — M5412 Radiculopathy, cervical region: Secondary | ICD-10-CM | POA: Diagnosis not present

## 2021-04-03 DIAGNOSIS — M9901 Segmental and somatic dysfunction of cervical region: Secondary | ICD-10-CM | POA: Diagnosis not present

## 2021-04-03 DIAGNOSIS — M9902 Segmental and somatic dysfunction of thoracic region: Secondary | ICD-10-CM | POA: Diagnosis not present

## 2021-04-03 DIAGNOSIS — M9903 Segmental and somatic dysfunction of lumbar region: Secondary | ICD-10-CM | POA: Diagnosis not present

## 2021-04-17 ENCOUNTER — Encounter: Payer: Self-pay | Admitting: Physician Assistant

## 2021-04-17 DIAGNOSIS — M5412 Radiculopathy, cervical region: Secondary | ICD-10-CM | POA: Diagnosis not present

## 2021-04-17 DIAGNOSIS — M9901 Segmental and somatic dysfunction of cervical region: Secondary | ICD-10-CM | POA: Diagnosis not present

## 2021-04-17 DIAGNOSIS — M9903 Segmental and somatic dysfunction of lumbar region: Secondary | ICD-10-CM | POA: Diagnosis not present

## 2021-04-17 DIAGNOSIS — M9902 Segmental and somatic dysfunction of thoracic region: Secondary | ICD-10-CM | POA: Diagnosis not present

## 2021-04-21 ENCOUNTER — Encounter: Payer: Self-pay | Admitting: Gastroenterology

## 2021-04-23 ENCOUNTER — Encounter: Payer: Self-pay | Admitting: Gastroenterology

## 2021-04-23 NOTE — H&P (Signed)
? ?Pre-Procedure H&P ?  ?Patient ID: Paul Jacobs is a 51 y.o. male. ? ?Gastroenterology Provider: Annamaria Helling, DO ? ?Referring Provider: Octavia Bruckner, PA ?PCP: Patient, No Pcp Per (Inactive) ? ?Date: 04/24/2021 ? ?HPI ?[name] is a 51 y.o. male who presents today for Colonoscopy for Initial screening colonoscopy. ?Family history of colon cancer or polyps.  No history of abdominal surgery.   ?Normal bowel movements without melena hematochezia diarrhea or constipation ?Patient is a type I diabetic on insulin.  Last reported lab values creatinine 0.71 A1c 5.4 LFTs within normal limits hemoglobin 15 MCV 85 platelets 343,000 ? ?Past Medical History:  ?Diagnosis Date  ? Diabetes mellitus, type 2 ([place])   ? History of chicken pox   ? History of kidney stones   ? Hypothyroidism   ? Low HDL (under 40)   ? Tinea corporis   ? ? ?Past Surgical History:  ?Procedure Laterality Date  ? INGUINAL HERNIA REPAIR Bilateral 2005  ? ? ?Family History ?No h/o GI disease or malignancy ? ?Review of Systems  ?Constitutional:  Negative for activity change, appetite change, chills, diaphoresis, fatigue, fever and unexpected weight change.  ?HENT:  Negative for trouble swallowing and voice change.   ?Respiratory:  Negative for shortness of breath and wheezing.   ?Cardiovascular:  Negative for chest pain, palpitations and leg swelling.  ?Gastrointestinal:  Negative for abdominal distention, abdominal pain, anal bleeding, blood in stool, constipation, diarrhea, nausea and vomiting.  ?Musculoskeletal:  Negative for arthralgias and myalgias.  ?Skin:  Negative for color change and pallor.  ?Neurological:  Negative for dizziness, syncope and weakness.  ?Psychiatric/Behavioral:  Negative for confusion. The patient is not nervous/anxious.   ?All other systems reviewed and are negative.  ? ?Medications ?No current facility-administered medications on file prior to encounter.  ? ?Current Outpatient Medications on File Prior to  Encounter  ?Medication Sig Dispense Refill  ? aspirin 325 MG tablet aspirin 325 mg tablet    ? ciclopirox (PENLAC) 8 % solution Apply topically at bedtime. Apply over nail and surrounding skin.  After seven (7) days, may remove with alcohol and continue cycle. 6.6 mL 6  ? glucose blood (CONTOUR NEXT TEST) test strip TEST 4 TIMES DAILY    ? glucose blood (FREESTYLE LITE) test strip Use as instructed 100 each 12  ? GVOKE HYPOPEN 2-PACK 1 MG/0.2ML SOAJ Inject into the skin.    ? insulin aspart (NOVOLOG FLEXPEN) 100 UNIT/ML FlexPen INJECT 5 TO 7 UNITS UNDER THE SKIN THREE TIMES A DAY BEFORE MEALS** USE 1:15 SLIDING SCALE IF >180 15 mL 5  ? Insulin Glargine (BASAGLAR KWIKPEN) 100 UNIT/ML Inject 15 Units into the skin daily. (Patient taking differently: Inject 17 Units into the skin daily.) 13.5 mL 1  ? Insulin Pen Needle (BD PEN NEEDLE NANO 2ND GEN) 32G X 4 MM MISC Use With Insulin Pen Four Times A Day 100 each 2  ? levothyroxine (SYNTHROID) 125 MCG tablet Take 1 tablet (125 mcg total) by mouth daily. 90 tablet 3  ? meloxicam (MOBIC) 15 MG tablet Take 1 tablet by mouth daily as needed.  0  ? meloxicam (MOBIC) 7.5 MG tablet Take 7.5 mg by mouth 2 (two) times daily.    ? metFORMIN (GLUCOPHAGE) 1000 MG tablet Take 1 tablet (1,000 mg total) by mouth 2 (two) times daily with a meal. 180 tablet 2  ? Omega-3 Fatty Acids (FISH OIL PO) Take 1 capsule by mouth daily.    ? Pneumococcal  Vac Polyvalent (PNEUMOVAX 23 IJ) Pneumovax-23 25 mcg/0.5 mL injection syringe    ? polyethylene glycol-electrolytes (NULYTELY) 420 g solution TAKE AS DIRECTED FOR COLON PREP.    ? ? ?Pertinent medications related to GI and procedure were reviewed by me with the patient prior to the procedure ? ? ?Current Facility-Administered Medications:  ?  0.9 %  sodium chloride infusion, , Intravenous, Continuous, Annamaria Helling, DO, Last Rate: 20 mL/hr at 04/24/21 0703, 20 mL/hr at 04/24/21 0703 ?  ?  ? ?No Known Allergies ?Allergies were reviewed by me  prior to the procedure ? ?Objective  ? ? ?Vitals:  ? 04/24/21 0649  ?BP: 131/82  ?Pulse: (!) 57  ?Resp: 20  ?Temp: 97.6 ?F (36.4 ?C)  ?TempSrc: Temporal  ?Weight: 80.3 kg  ?Height: 6\' 2"  (1.88 m)  ? ? ? ?Physical Exam ?Vitals and nursing note reviewed.  ?Constitutional:   ?   General: He is not in acute distress. ?   Appearance: Normal appearance. He is not ill-appearing, toxic-appearing or diaphoretic.  ?HENT:  ?   Head: Normocephalic and atraumatic.  ?   Nose: Nose normal.  ?   Mouth/Throat:  ?   Mouth: Mucous membranes are moist.  ?   Pharynx: Oropharynx is clear.  ?Eyes:  ?   General: No scleral icterus. ?   Extraocular Movements: Extraocular movements intact.  ?Cardiovascular:  ?   Rate and Rhythm: Regular rhythm. Bradycardia present.  ?   Heart sounds: Normal heart sounds. No murmur heard. ?  No friction rub. No gallop.  ?Pulmonary:  ?   Effort: Pulmonary effort is normal. No respiratory distress.  ?   Breath sounds: Normal breath sounds. No wheezing, rhonchi or rales.  ?Abdominal:  ?   General: Bowel sounds are normal. There is no distension.  ?   Palpations: Abdomen is soft.  ?   Tenderness: There is no abdominal tenderness. There is no guarding or rebound.  ?Musculoskeletal:  ?   Cervical back: Neck supple.  ?   Right lower leg: No edema.  ?   Left lower leg: No edema.  ?Skin: ?   General: Skin is warm and dry.  ?   Coloration: Skin is not jaundiced or pale.  ?Neurological:  ?   General: No focal deficit present.  ?   Mental Status: He is alert and oriented to person, place, and time. Mental status is at baseline.  ?Psychiatric:     ?   Mood and Affect: Mood normal.     ?   Behavior: Behavior normal.     ?   Thought Content: Thought content normal.     ?   Judgment: Judgment normal.  ? ? ? ?Assessment:  ?[name] is a 51 y.o. male  who presents today for Colonoscopy for Initial screening colonoscopy. ? ?Plan:  ?Colonoscopy with possible intervention today ? ?Colonoscopy with possible biopsy,  control of bleeding, polypectomy, and interventions as necessary has been discussed with the patient/patient representative. Informed consent was obtained from the patient/patient representative after explaining the indication, nature, and risks of the procedure including but not limited to death, bleeding, perforation, missed neoplasm/lesions, cardiorespiratory compromise, and reaction to medications. Opportunity for questions was given and appropriate answers were provided. Patient/patient representative has verbalized understanding is amenable to undergoing the procedure. ? ? ?Annamaria Helling, DO  ?[facility] ? ?Portions of the record may have been created with voice recognition software. Occasional wrong-word or 'sound-a-like' substitutions may  have occurred due to the inherent limitations of voice recognition software.  Read the chart carefully and recognize, using context, where substitutions may have occurred. ?

## 2021-04-24 ENCOUNTER — Ambulatory Visit
Admission: RE | Admit: 2021-04-24 | Discharge: 2021-04-24 | Disposition: A | Discharge status: Home / Self Care | Payer: 59 | Attending: Gastroenterology | Admitting: Gastroenterology

## 2021-04-24 ENCOUNTER — Encounter: Payer: Self-pay | Admitting: Gastroenterology

## 2021-04-24 ENCOUNTER — Encounter
Admission: RE | Disposition: A | Discharge status: Home / Self Care | Payer: Self-pay | Source: Home / Self Care | Attending: Gastroenterology

## 2021-04-24 ENCOUNTER — Ambulatory Visit: Payer: 59 | Admitting: Registered Nurse

## 2021-04-24 DIAGNOSIS — E119 Type 2 diabetes mellitus without complications: Secondary | ICD-10-CM | POA: Diagnosis not present

## 2021-04-24 DIAGNOSIS — Z794 Long term (current) use of insulin: Secondary | ICD-10-CM | POA: Diagnosis not present

## 2021-04-24 DIAGNOSIS — Z791 Long term (current) use of non-steroidal anti-inflammatories (NSAID): Secondary | ICD-10-CM | POA: Diagnosis not present

## 2021-04-24 DIAGNOSIS — E039 Hypothyroidism, unspecified: Secondary | ICD-10-CM | POA: Insufficient documentation

## 2021-04-24 DIAGNOSIS — K635 Polyp of colon: Secondary | ICD-10-CM | POA: Diagnosis not present

## 2021-04-24 DIAGNOSIS — K5730 Diverticulosis of large intestine without perforation or abscess without bleeding: Secondary | ICD-10-CM | POA: Insufficient documentation

## 2021-04-24 DIAGNOSIS — Z7984 Long term (current) use of oral hypoglycemic drugs: Secondary | ICD-10-CM | POA: Diagnosis not present

## 2021-04-24 DIAGNOSIS — K621 Rectal polyp: Secondary | ICD-10-CM | POA: Diagnosis not present

## 2021-04-24 DIAGNOSIS — K640 First degree hemorrhoids: Secondary | ICD-10-CM | POA: Diagnosis not present

## 2021-04-24 DIAGNOSIS — Z1211 Encounter for screening for malignant neoplasm of colon: Secondary | ICD-10-CM | POA: Diagnosis not present

## 2021-04-24 HISTORY — DX: Personal history of urinary calculi: Z87.442

## 2021-04-24 HISTORY — DX: Personal history of other infectious and parasitic diseases: Z86.19

## 2021-04-24 HISTORY — PX: COLONOSCOPY WITH PROPOFOL: SHX5780

## 2021-04-24 HISTORY — DX: Calculus of kidney: N20.0

## 2021-04-24 LAB — GLUCOSE, CAPILLARY
Glucose-Capillary: 60 mg/dL — ABNORMAL LOW (ref 70–99)
Glucose-Capillary: 81 mg/dL (ref 70–99)

## 2021-04-24 SURGERY — COLONOSCOPY WITH PROPOFOL
Anesthesia: General

## 2021-04-24 MED ORDER — DEXTROSE 50 % IV SOLN
INTRAVENOUS | Status: AC
  Administered 2021-04-24: 12.5 mL
  Filled 2021-04-24: qty 50

## 2021-04-24 MED ORDER — EPHEDRINE 5 MG/ML INJ
INTRAVENOUS | Status: AC
  Filled 2021-04-24: qty 5

## 2021-04-24 MED ORDER — DEXTROSE 50 % IV SOLN
INTRAVENOUS | Status: DC | PRN
  Administered 2021-04-24: 12.5 mL via INTRAVENOUS

## 2021-04-24 MED ORDER — PROPOFOL 500 MG/50ML IV EMUL
INTRAVENOUS | Status: AC
  Filled 2021-04-24: qty 50

## 2021-04-24 MED ORDER — SODIUM CHLORIDE 0.9 % IV SOLN
INTRAVENOUS | Status: DC
  Administered 2021-04-24: 20 mL/h via INTRAVENOUS

## 2021-04-24 MED ORDER — EPHEDRINE SULFATE (PRESSORS) 50 MG/ML IJ SOLN
INTRAMUSCULAR | Status: DC | PRN
  Administered 2021-04-24: 5 mg via INTRAVENOUS
  Administered 2021-04-24: 10 mg via INTRAVENOUS

## 2021-04-24 MED ORDER — PROPOFOL 500 MG/50ML IV EMUL
INTRAVENOUS | Status: DC | PRN
  Administered 2021-04-24: 140 ug/kg/min via INTRAVENOUS

## 2021-04-24 MED ORDER — DEXMEDETOMIDINE HCL IN NACL 80 MCG/20ML IV SOLN
INTRAVENOUS | Status: AC
  Filled 2021-04-24: qty 20

## 2021-04-24 MED ORDER — PROPOFOL 10 MG/ML IV BOLUS
INTRAVENOUS | Status: DC | PRN
Start: 2021-04-24 — End: 2021-04-24
  Administered 2021-04-24: 70 mg via INTRAVENOUS

## 2021-04-24 NOTE — Interval H&P Note (Signed)
History and Physical Interval Note: Preprocedure H&P from 04/24/21 ? was reviewed and there was no interval change after seeing and examining the patient.  Written consent was obtained from the patient after discussion of risks, benefits, and alternatives. Patient has consented to proceed with Colonoscopy with possible intervention ? ? ?04/24/2021 ?7:25 AM ? ?Paul Jacobs  has presented today for surgery, with the diagnosis of SCREENING.  The various methods of treatment have been discussed with the patient and family. After consideration of risks, benefits and other options for treatment, the patient has consented to  Procedure(s) with comments: ?COLONOSCOPY WITH PROPOFOL (N/A) - IDDM as a surgical intervention.  The patient's history has been reviewed, patient examined, no change in status, stable for surgery.  I have reviewed the patient's chart and labs.  Questions were answered to the patient's satisfaction.   ? ? ?Jaynie Collins ? ? ?

## 2021-04-24 NOTE — Anesthesia Preprocedure Evaluation (Signed)
Anesthesia Evaluation  ?Patient identified by MRN, date of birth, ID band ?Patient awake ? ? ? ?Reviewed: ?Allergy & Precautions, H&P , NPO status , Patient's Chart, lab work & pertinent test results, reviewed documented beta blocker date and time  ? ?Airway ?Mallampati: II ? ? ?Neck ROM: full ? ? ? Dental ? ?(+) Teeth Intact ?  ?Pulmonary ?neg pulmonary ROS, former smoker,  ?  ?Pulmonary exam normal ? ? ? ? ? ? ? Cardiovascular ?Exercise Tolerance: Good ?negative cardio ROS ?Normal cardiovascular exam ?Rhythm:regular Rate:Normal ? ? ?  ?Neuro/Psych ?negative neurological ROS ? negative psych ROS  ? GI/Hepatic ?negative GI ROS, Neg liver ROS,   ?Endo/Other  ?diabetes, Well Controlled, Type 1, Insulin DependentHypothyroidism  ? Renal/GU ?negative Renal ROS  ?negative genitourinary ?  ?Musculoskeletal ? ? Abdominal ?  ?Peds ? Hematology ?negative hematology ROS ?(+)   ?Anesthesia Other Findings ?Past Medical History: ?No date: Diabetes mellitus, type 2 ([facility]) ?No date: History of chicken pox ?No date: History of kidney stones ?No date: Hypothyroidism ?No date: Low HDL (under 40) ?No date: Tinea corporis ?Past Surgical History: ?2005: INGUINAL HERNIA REPAIR; Bilateral ?BMI   ? Body Mass Index: 22.73 kg/m?  ?  ? Reproductive/Obstetrics ?negative OB ROS ? ?  ? ? ? ? ? ? ? ? ? ? ? ? ? ?  ?  ? ? ? ? ? ? ? ? ?Anesthesia Physical ?Anesthesia Plan ? ?ASA: 2 ? ?Anesthesia Plan: General  ? ?Post-op Pain Management:   ? ?Induction:  ? ?PONV Risk Score and Plan:  ? ?Airway Management Planned:  ? ?Additional Equipment:  ? ?Intra-op Plan:  ? ?Post-operative Plan:  ? ?Informed Consent: I have reviewed the patients History and Physical, chart, labs and discussed the procedure including the risks, benefits and alternatives for the proposed anesthesia with the patient or authorized representative who has indicated his/her understanding and acceptance.  ? ? ? ?Dental Advisory Given ? ?Plan Discussed  with: CRNA ? ?Anesthesia Plan Comments:   ? ? ? ? ? ? ?Anesthesia Quick Evaluation ? ?

## 2021-04-24 NOTE — Transfer of Care (Signed)
Immediate Anesthesia Transfer of Care Note ? ?Patient: Paul Jacobs ? ?Procedure(s) Performed: COLONOSCOPY WITH PROPOFOL ? ?Patient Location: PACU ? ?Anesthesia Type:General ? ?Level of Consciousness: awake, alert  and oriented ? ?Airway & Oxygen Therapy: Patient Spontanous Breathing ? ?Post-op Assessment: Report given to RN and Post -op Vital signs reviewed and stable ? ?Post vital signs: Reviewed and stable ? ?Last Vitals:  ?Vitals Value Taken Time  ?BP    ?Temp    ?Pulse 69 04/24/21 0813  ?Resp 14 04/24/21 0813  ?SpO2 100 % 04/24/21 0813  ?Vitals shown include unvalidated device data. ? ?Last Pain:  ?Vitals:  ? 04/24/21 0811  ?TempSrc:   ?PainSc: 0-No pain  ?   ? ?  ? ?Complications: No notable events documented. ?

## 2021-04-24 NOTE — Op Note (Signed)
[facility] ?Gastroenterology ?Patient Name: Paul Jacobs ?Procedure Date: 04/24/2021 7:34 AM ?MRN: [id number] ?Account #: [id number] ?Date of Birth: 12/10/1970 ?Admit Type: Outpatient ?Age: 51 ?Room: [facility] ENDO ROOM 2 ?Gender: Male ?Note Status: Finalized ?Instrument Name: Colonoscope [id number] ?Procedure:             Colonoscopy ?Indications:           Screening for colorectal malignant neoplasm ?Providers:             Annamaria Helling DO, DO ?Medicines:             Monitored Anesthesia Care ?Complications:         No immediate complications. Estimated blood loss:  ?                       Minimal. ?Procedure:             Pre-Anesthesia Assessment: ?                       - Prior to the procedure, a History and Physical was  ?                       performed, and patient medications and allergies were  ?                       reviewed. The patient is competent. The risks and  ?                       benefits of the procedure and the sedation options and  ?                       risks were discussed with the patient. All questions  ?                       were answered and informed consent was obtained.  ?                       Patient identification and proposed procedure were  ?                       verified by the physician, the nurse, the anesthetist  ?                       and the technician in the endoscopy suite. Mental  ?                       Status Examination: alert and oriented. Airway  ?                       Examination: normal oropharyngeal airway and neck  ?                       mobility. Respiratory Examination: clear to  ?                       auscultation. CV Examination: RRR, no murmurs, no S3  ?                       or S4. Prophylactic Antibiotics: The patient does not  ?  require prophylactic antibiotics. Prior  ?                       Anticoagulants: The patient has taken no previous  ?                       anticoagulant or antiplatelet agents.  ASA Grade  ?                       Assessment: II - A patient with mild systemic disease.  ?                       After reviewing the risks and benefits, the patient  ?                       was deemed in satisfactory condition to undergo the  ?                       procedure. The anesthesia plan was to use monitored  ?                       anesthesia care (MAC). Immediately prior to  ?                       administration of medications, the patient was  ?                       re-assessed for adequacy to receive sedatives. The  ?                       heart rate, respiratory rate, oxygen saturations,  ?                       blood pressure, adequacy of pulmonary ventilation, and  ?                       response to care were monitored throughout the  ?                       procedure. The physical status of the patient was  ?                       re-assessed after the procedure. ?                       After obtaining informed consent, the colonoscope was  ?                       passed under direct vision. Throughout the procedure,  ?                       the patient's blood pressure, pulse, and oxygen  ?                       saturations were monitored continuously. The  ?                       Colonoscope was introduced through the anus and  ?  advanced to the the terminal ileum, with  ?                       identification of the appendiceal orifice and IC  ?                       valve. The colonoscopy was performed without  ?                       difficulty. The patient tolerated the procedure well.  ?                       The quality of the bowel preparation was evaluated  ?                       using the BBPS [facility] Bowel Preparation Scale) with  ?                       scores of: Right Colon = 2 (minor amount of residual  ?                       staining, small fragments of stool and/or opaque  ?                       liquid, but mucosa seen well) and Left Colon = 3  ?                        (entire mucosa seen well with no residual staining,  ?                       small fragments of stool or opaque liquid). The total  ?                       BBPS score equals 5. The quality of the bowel  ?                       preparation was excellent. ?Findings: ?     The perianal and digital rectal examinations were normal. Pertinent  ?     negatives include normal sphincter tone. ?     Multiple small-mouthed diverticula were found in the recto-sigmoid colon  ?     and sigmoid colon. Estimated blood loss: none. ?     Non-bleeding internal hemorrhoids were found during retroflexion. The  ?     hemorrhoids were Grade I (internal hemorrhoids that do not prolapse).  ?     Estimated blood loss: none. ?     Three sessile polyps were found in the rectum, transverse colon and  ?     cecum. The polyps were 1 to 2 mm in size. These polyps were removed with  ?     a cold biopsy forceps. Resection and retrieval were complete. Estimated  ?     blood loss was minimal. ?     The exam was otherwise without abnormality on direct and retroflexion  ?     views. ?Impression:            - Diverticulosis in the recto-sigmoid colon and in the  ?  sigmoid colon. ?                       - Non-bleeding internal hemorrhoids. ?                       - Three 1 to 2 mm polyps in the rectum, in the  ?                       transverse colon and in the cecum, removed with a cold  ?                       biopsy forceps. Resected and retrieved. ?                       - The examination was otherwise normal on direct and  ?                       retroflexion views. ?Recommendation:        - Discharge patient to home. ?                       - Resume previous diet. ?                       - Continue present medications. ?                       - Recommd initiating overt the counter fiber  ?                       supplement such as metamucil ?                       Recommend initiating proton pump inhibitor like  over  ?                       the counter omeprazole for gastric protection if goign  ?                       to continue taking ibuprofen regularly ?                       - No aspirin, ibuprofen, naproxen, or other  ?                       non-steroidal anti-inflammatory drugs for 5 days after  ?                       polyp removal. ?                       - Await pathology results. ?                       - Repeat colonoscopy for surveillance based on  ?                       pathology results. ?                       - Return to referring physician  as previously  ?                       scheduled. ?                       - The findings and recommendations were discussed with  ?                       the patient. ?Procedure Code(s):     --- Professional --- ?                       [phone], Colonoscopy, flexible; with biopsy, single or  ?                       multiple ?Diagnosis Code(s):     --- Professional --- ?                       Z12.11, Encounter for screening for malignant neoplasm  ?                       of colon ?                       K64.0, First degree hemorrhoids ?                       K62.1, Rectal polyp ?                       K63.5, Polyp of colon ?                       K57.30, Diverticulosis of large intestine without  ?                       perforation or abscess without bleeding ?CPT copyright 2019 [employer]. All rights reserved. ?The codes documented in this report are preliminary and upon coder review may  ?be revised to meet current compliance requirements. ?Attending Participation: ?     I personally performed the entire procedure. ?Volney American, DO ?Annamaria Helling DO, DO ?04/24/2021 8:13:11 AM ?This report has been signed electronically. ?Number of Addenda: 0 ?Note Initiated On: 04/24/2021 7:34 AM ?Scope Withdrawal Time: 0 hours 18 minutes 41 seconds  ?Total Procedure Duration: 0 hours 25 minutes 24 seconds  ?Estimated Blood Loss:  Estimated blood loss was  minimal. ?     [facility] ?

## 2021-04-25 ENCOUNTER — Encounter: Payer: Self-pay | Admitting: Gastroenterology

## 2021-04-25 LAB — SURGICAL PATHOLOGY

## 2021-04-30 NOTE — Anesthesia Postprocedure Evaluation (Signed)
Anesthesia Post Note ? ?Patient: Paul Jacobs ? ?Procedure(s) Performed: COLONOSCOPY WITH PROPOFOL ? ?Patient location during evaluation: PACU ?Anesthesia Type: General ?Level of consciousness: awake and alert ?Pain management: pain level controlled ?Vital Signs Assessment: post-procedure vital signs reviewed and stable ?Respiratory status: spontaneous breathing, nonlabored ventilation, respiratory function stable and patient connected to nasal cannula oxygen ?Cardiovascular status: blood pressure returned to baseline and stable ?Postop Assessment: no apparent nausea or vomiting ?Anesthetic complications: no ? ? ?No notable events documented. ? ? ?Last Vitals:  ?Vitals:  ? 04/24/21 0811 04/24/21 0854  ?BP:  118/70  ?Pulse:    ?Resp:    ?Temp: (!) 35.8 ?C   ?  ?Last Pain:  ?Vitals:  ? 04/25/21 0737  ?TempSrc:   ?PainSc: 0-No pain  ? ? ?  ?  ?  ?  ?  ?  ? ?Molli Barrows ? ? ? ? ?

## 2021-05-01 DIAGNOSIS — M9903 Segmental and somatic dysfunction of lumbar region: Secondary | ICD-10-CM | POA: Diagnosis not present

## 2021-05-01 DIAGNOSIS — M9902 Segmental and somatic dysfunction of thoracic region: Secondary | ICD-10-CM | POA: Diagnosis not present

## 2021-05-01 DIAGNOSIS — M9901 Segmental and somatic dysfunction of cervical region: Secondary | ICD-10-CM | POA: Diagnosis not present

## 2021-05-01 DIAGNOSIS — M5412 Radiculopathy, cervical region: Secondary | ICD-10-CM | POA: Diagnosis not present

## 2021-05-08 ENCOUNTER — Other Ambulatory Visit: Payer: Self-pay | Admitting: Physician Assistant

## 2021-05-08 DIAGNOSIS — E119 Type 2 diabetes mellitus without complications: Secondary | ICD-10-CM

## 2021-05-11 ENCOUNTER — Encounter: Payer: Self-pay | Admitting: Physician Assistant

## 2021-05-11 ENCOUNTER — Telehealth: Payer: Self-pay

## 2021-05-11 NOTE — Telephone Encounter (Signed)
Received faxed Rx refill request from [facility] for Metformin 1000 mg po bid. ? ?Upon entering Rx refill info into Epic, a pended Rx refill request for Metformin 1000 mg opened up that was sent electronically by pharmacy to [facility] ED. ? ?Re-routed the pended Rx refill request to ?Randel Pigg, PA-C. ? ?[facility] ?

## 2021-05-15 DIAGNOSIS — M9902 Segmental and somatic dysfunction of thoracic region: Secondary | ICD-10-CM | POA: Diagnosis not present

## 2021-05-15 DIAGNOSIS — M5412 Radiculopathy, cervical region: Secondary | ICD-10-CM | POA: Diagnosis not present

## 2021-05-15 DIAGNOSIS — M9901 Segmental and somatic dysfunction of cervical region: Secondary | ICD-10-CM | POA: Diagnosis not present

## 2021-05-15 DIAGNOSIS — M9903 Segmental and somatic dysfunction of lumbar region: Secondary | ICD-10-CM | POA: Diagnosis not present

## 2021-06-05 DIAGNOSIS — M5412 Radiculopathy, cervical region: Secondary | ICD-10-CM | POA: Diagnosis not present

## 2021-06-05 DIAGNOSIS — M9901 Segmental and somatic dysfunction of cervical region: Secondary | ICD-10-CM | POA: Diagnosis not present

## 2021-06-05 DIAGNOSIS — M9902 Segmental and somatic dysfunction of thoracic region: Secondary | ICD-10-CM | POA: Diagnosis not present

## 2021-06-05 DIAGNOSIS — M9903 Segmental and somatic dysfunction of lumbar region: Secondary | ICD-10-CM | POA: Diagnosis not present

## 2021-06-06 ENCOUNTER — Other Ambulatory Visit: Payer: Self-pay | Admitting: Physician Assistant

## 2021-06-06 ENCOUNTER — Encounter: Payer: Self-pay | Admitting: Physician Assistant

## 2021-06-06 DIAGNOSIS — E109 Type 1 diabetes mellitus without complications: Secondary | ICD-10-CM

## 2021-06-06 MED ORDER — NOVOLOG FLEXPEN 100 UNIT/ML ~~LOC~~ SOPN: PEN_INJECTOR | SUBCUTANEOUS | 5 refills | Status: DC

## 2021-06-06 MED ORDER — LEVOTHYROXINE SODIUM 125 MCG PO TABS: 125.0000 ug | ORAL_TABLET | Freq: Every day | ORAL | 3 refills | Status: DC

## 2021-06-06 MED ORDER — BASAGLAR KWIKPEN 100 UNIT/ML ~~LOC~~ SOPN: 17.0000 [IU] | PEN_INJECTOR | Freq: Every day | SUBCUTANEOUS | 1 refills | Status: DC

## 2021-06-08 DIAGNOSIS — L814 Other melanin hyperpigmentation: Secondary | ICD-10-CM | POA: Diagnosis not present

## 2021-06-08 DIAGNOSIS — D2272 Melanocytic nevi of left lower limb, including hip: Secondary | ICD-10-CM | POA: Diagnosis not present

## 2021-06-08 DIAGNOSIS — D2262 Melanocytic nevi of left upper limb, including shoulder: Secondary | ICD-10-CM | POA: Diagnosis not present

## 2021-06-08 DIAGNOSIS — D225 Melanocytic nevi of trunk: Secondary | ICD-10-CM | POA: Diagnosis not present

## 2021-06-08 DIAGNOSIS — D2271 Melanocytic nevi of right lower limb, including hip: Secondary | ICD-10-CM | POA: Diagnosis not present

## 2021-06-08 DIAGNOSIS — D2261 Melanocytic nevi of right upper limb, including shoulder: Secondary | ICD-10-CM | POA: Diagnosis not present

## 2021-06-08 DIAGNOSIS — L570 Actinic keratosis: Secondary | ICD-10-CM | POA: Diagnosis not present

## 2021-06-14 DIAGNOSIS — B351 Tinea unguium: Secondary | ICD-10-CM | POA: Diagnosis not present

## 2021-06-14 DIAGNOSIS — L03032 Cellulitis of left toe: Secondary | ICD-10-CM | POA: Diagnosis not present

## 2021-06-14 DIAGNOSIS — E109 Type 1 diabetes mellitus without complications: Secondary | ICD-10-CM | POA: Diagnosis not present

## 2021-06-14 DIAGNOSIS — L600 Ingrowing nail: Secondary | ICD-10-CM | POA: Diagnosis not present

## 2021-06-19 ENCOUNTER — Other Ambulatory Visit: Payer: Self-pay | Admitting: Physician Assistant

## 2021-06-19 DIAGNOSIS — M19012 Primary osteoarthritis, left shoulder: Secondary | ICD-10-CM | POA: Diagnosis not present

## 2021-06-24 DIAGNOSIS — M5412 Radiculopathy, cervical region: Secondary | ICD-10-CM | POA: Diagnosis not present

## 2021-06-24 DIAGNOSIS — M9901 Segmental and somatic dysfunction of cervical region: Secondary | ICD-10-CM | POA: Diagnosis not present

## 2021-06-24 DIAGNOSIS — M9902 Segmental and somatic dysfunction of thoracic region: Secondary | ICD-10-CM | POA: Diagnosis not present

## 2021-06-24 DIAGNOSIS — M9903 Segmental and somatic dysfunction of lumbar region: Secondary | ICD-10-CM | POA: Diagnosis not present

## 2021-06-28 ENCOUNTER — Ambulatory Visit
Admission: RE | Admit: 2021-06-28 | Discharge: 2021-06-28 | Disposition: A | Discharge status: Home / Self Care | Payer: 59 | Source: Ambulatory Visit | Attending: Physician Assistant | Admitting: Physician Assistant

## 2021-06-28 DIAGNOSIS — S43432A Superior glenoid labrum lesion of left shoulder, initial encounter: Secondary | ICD-10-CM | POA: Diagnosis not present

## 2021-06-28 DIAGNOSIS — M19012 Primary osteoarthritis, left shoulder: Secondary | ICD-10-CM

## 2021-06-29 ENCOUNTER — Ambulatory Visit: Payer: Self-pay | Admitting: Physician Assistant

## 2021-06-29 ENCOUNTER — Encounter: Payer: Self-pay | Admitting: Physician Assistant

## 2021-06-29 DIAGNOSIS — E109 Type 1 diabetes mellitus without complications: Secondary | ICD-10-CM

## 2021-06-29 NOTE — Progress Notes (Signed)
? ?  Subjective: Diabetes  ? ? Patient ID: Paul Jacobs, male    DOB: 1971-01-13, 51 y.o.   MRN: [id number] ? ?HPI ?Patient presents for complaint of frustration using libre 2 reader to monitor his diabetes. ? ? ?Review of Systems ?Diabetes: Hyperlipidemia, and hypothyroidism. ?   ?Objective:  ? Physical Exam ? ?Physical exam was deferred. ? ? ?   ?Assessment & Plan: Diabetes  ? ?Advised patient to download the app for [place] 2 orders for history of using reader.  Patient will follow up telephonically if he is not having any success with recommendation. ?

## 2021-07-10 DIAGNOSIS — M24112 Other articular cartilage disorders, left shoulder: Secondary | ICD-10-CM | POA: Insufficient documentation

## 2021-07-10 DIAGNOSIS — M7582 Other shoulder lesions, left shoulder: Secondary | ICD-10-CM | POA: Insufficient documentation

## 2021-07-10 DIAGNOSIS — M19012 Primary osteoarthritis, left shoulder: Secondary | ICD-10-CM | POA: Insufficient documentation

## 2021-07-14 ENCOUNTER — Other Ambulatory Visit: Payer: Self-pay | Admitting: Physician Assistant

## 2021-07-20 ENCOUNTER — Other Ambulatory Visit: Payer: Self-pay | Admitting: Physician Assistant

## 2021-07-20 ENCOUNTER — Other Ambulatory Visit: Payer: Self-pay

## 2021-07-20 MED ORDER — FREESTYLE LIBRE 2 SENSOR MISC: 1.0000 | 6 refills | Status: DC

## 2021-07-20 NOTE — Telephone Encounter (Signed)
Received faxed Rx refill request from [facility] for [facility] 2 Sensors.  Upon entering Rx refill info into Epic, a box opened up for a pended Rx refill request for [employer] 2 Sensors opened up that was sent electronically by [employer].  Re-routed the pended Rx refill request to Nona Dell, PA-C.  [facility]

## 2021-07-31 DIAGNOSIS — M5412 Radiculopathy, cervical region: Secondary | ICD-10-CM | POA: Diagnosis not present

## 2021-07-31 DIAGNOSIS — M9902 Segmental and somatic dysfunction of thoracic region: Secondary | ICD-10-CM | POA: Diagnosis not present

## 2021-07-31 DIAGNOSIS — M9903 Segmental and somatic dysfunction of lumbar region: Secondary | ICD-10-CM | POA: Diagnosis not present

## 2021-07-31 DIAGNOSIS — M9901 Segmental and somatic dysfunction of cervical region: Secondary | ICD-10-CM | POA: Diagnosis not present

## 2021-08-10 DIAGNOSIS — L570 Actinic keratosis: Secondary | ICD-10-CM | POA: Diagnosis not present

## 2021-08-10 DIAGNOSIS — X32XXXA Exposure to sunlight, initial encounter: Secondary | ICD-10-CM | POA: Diagnosis not present

## 2021-08-14 ENCOUNTER — Encounter: Payer: Self-pay | Admitting: Physician Assistant

## 2021-08-14 ENCOUNTER — Other Ambulatory Visit: Payer: Self-pay

## 2021-08-14 DIAGNOSIS — E038 Other specified hypothyroidism: Secondary | ICD-10-CM

## 2021-08-16 ENCOUNTER — Other Ambulatory Visit: Payer: Self-pay | Admitting: Physician Assistant

## 2021-08-28 DIAGNOSIS — M9902 Segmental and somatic dysfunction of thoracic region: Secondary | ICD-10-CM | POA: Diagnosis not present

## 2021-08-28 DIAGNOSIS — M5412 Radiculopathy, cervical region: Secondary | ICD-10-CM | POA: Diagnosis not present

## 2021-08-28 DIAGNOSIS — M9903 Segmental and somatic dysfunction of lumbar region: Secondary | ICD-10-CM | POA: Diagnosis not present

## 2021-08-28 DIAGNOSIS — M9901 Segmental and somatic dysfunction of cervical region: Secondary | ICD-10-CM | POA: Diagnosis not present

## 2021-08-30 ENCOUNTER — Ambulatory Visit: Payer: Self-pay

## 2021-08-30 DIAGNOSIS — Z0000 Encounter for general adult medical examination without abnormal findings: Secondary | ICD-10-CM

## 2021-08-30 LAB — POCT URINALYSIS DIPSTICK
Bilirubin, UA: NEGATIVE
Blood, UA: NEGATIVE
Glucose, UA: NEGATIVE
Ketones, UA: NEGATIVE
Leukocytes, UA: NEGATIVE
Nitrite, UA: NEGATIVE
Protein, UA: NEGATIVE
Spec Grav, UA: 1.02 (ref 1.010–1.025)
Urobilinogen, UA: 0.2 E.U./dL
pH, UA: 6 (ref 5.0–8.0)

## 2021-08-31 LAB — CMP12+LP+TP+TSH+6AC+PSA+CBC…
ALT: 19 IU/L (ref 0–44)
AST: 15 IU/L (ref 0–40)
Albumin/Globulin Ratio: 2.4 — ABNORMAL HIGH (ref 1.2–2.2)
Albumin: 4.3 g/dL (ref 3.8–4.9)
Alkaline Phosphatase: 57 IU/L (ref 44–121)
BUN/Creatinine Ratio: 22 — ABNORMAL HIGH (ref 9–20)
BUN: 17 mg/dL (ref 6–24)
Basophils Absolute: 0 10*3/uL (ref 0.0–0.2)
Basos: 1 %
Bilirubin Total: 0.5 mg/dL (ref 0.0–1.2)
Calcium: 9.4 mg/dL (ref 8.7–10.2)
Chloride: 103 mmol/L (ref 96–106)
Chol/HDL Ratio: 2.8 ratio (ref 0.0–5.0)
Cholesterol, Total: 192 mg/dL (ref 100–199)
Creatinine, Ser: 0.77 mg/dL (ref 0.76–1.27)
EOS (ABSOLUTE): 0.2 10*3/uL (ref 0.0–0.4)
Eos: 4 %
Estimated CHD Risk: <0.5 times avg. (ref 0.0–1.0)
Free Thyroxine Index: 3.4 (ref 1.2–4.9)
GGT: 10 IU/L (ref 0–65)
Globulin, Total: 1.8 g/dL (ref 1.5–4.5)
Glucose: 63 mg/dL — ABNORMAL LOW (ref 70–99)
HDL: 68 mg/dL (ref >39)
Hematocrit: 43 % (ref 37.5–51.0)
Hemoglobin: 14.8 g/dL (ref 13.0–17.7)
Immature Grans (Abs): 0 10*3/uL (ref 0.0–0.1)
Immature Granulocytes: 0 %
Iron: 110 ug/dL (ref 38–169)
LDH: 137 IU/L (ref 121–224)
LDL Chol Calc (NIH): 113 mg/dL — ABNORMAL HIGH (ref 0–99)
Lymphocytes Absolute: 1.6 10*3/uL (ref 0.7–3.1)
Lymphs: 31 %
MCH: 31 pg (ref 26.6–33.0)
MCHC: 34.4 g/dL (ref 31.5–35.7)
MCV: 90 fL (ref 79–97)
Monocytes Absolute: 0.4 10*3/uL (ref 0.1–0.9)
Monocytes: 8 %
Neutrophils Absolute: 2.9 10*3/uL (ref 1.4–7.0)
Neutrophils: 56 %
Phosphorus: 4.1 mg/dL (ref 2.8–4.1)
Platelets: 285 10*3/uL (ref 150–450)
Potassium: 4.3 mmol/L (ref 3.5–5.2)
Prostate Specific Ag, Serum: 0.7 ng/mL (ref 0.0–4.0)
RBC: 4.77 x10E6/uL (ref 4.14–5.80)
RDW: 12.7 % (ref 11.6–15.4)
Sodium: 139 mmol/L (ref 134–144)
T3 Uptake Ratio: 29 % (ref 24–39)
T4, Total: 11.6 ug/dL (ref 4.5–12.0)
TSH: 0.266 u[IU]/mL — ABNORMAL LOW (ref 0.450–4.500)
Total Protein: 6.1 g/dL (ref 6.0–8.5)
Triglycerides: 60 mg/dL (ref 0–149)
Uric Acid: 5 mg/dL (ref 3.8–8.4)
VLDL Cholesterol Cal: 11 mg/dL (ref 5–40)
WBC: 5.1 10*3/uL (ref 3.4–10.8)
eGFR: 108 mL/min/{1.73_m2} (ref >59)

## 2021-08-31 LAB — HGB A1C W/O EAG: Hgb A1c MFr Bld: 6.1 % — ABNORMAL HIGH (ref 4.8–5.6)

## 2021-08-31 LAB — MICROALBUMIN / CREATININE URINE RATIO
Creatinine, Urine: 86.1 mg/dL
Microalb/Creat Ratio: <3 mg/g creat (ref 0–29)
Microalbumin, Urine: <3 ug/mL

## 2021-09-06 ENCOUNTER — Encounter: Payer: Self-pay | Admitting: Physician Assistant

## 2021-09-06 ENCOUNTER — Ambulatory Visit: Payer: Self-pay | Admitting: Physician Assistant

## 2021-09-06 VITALS — BP 100/62 | HR 64 | Temp 97.4°F | Resp 16 | Wt 170.4 lb

## 2021-09-06 DIAGNOSIS — E109 Type 1 diabetes mellitus without complications: Secondary | ICD-10-CM

## 2021-09-06 DIAGNOSIS — Z0000 Encounter for general adult medical examination without abnormal findings: Secondary | ICD-10-CM

## 2021-09-06 NOTE — Progress Notes (Signed)
Stated here for yearly physical.  No c/o dizziness, stated maybe some dehydration and drinking water regularly during the hot days.  B/P check manual.  Only c/o some left shoulder discomfort and tolerable, planning surgery upcoming, stated recent MRI.

## 2021-09-06 NOTE — Progress Notes (Signed)
[facility]  ____________________________________________   None    (approximate)  I have reviewed the triage vital signs and the nursing notes.   HISTORY  Chief Complaint No chief complaint on file.  Paul Jacobs is a 51 y.o. male patient presents for annual physical exam.  Patient was concerned for continued left shoulder pain.  This complaint is followed by orthopedics.  The patient is scheduled for surgical intervention in November 2023.         Past Medical History:  Diagnosis Date   Diabetes mellitus, type 2 ([place])    History of chicken pox    History of kidney stones    Hypothyroidism    Low HDL (under 40)    Tinea corporis     Patient Active Problem List   Diagnosis Date Noted   Fracture of distal phalanx of finger 01/04/2020   Digital mucous cyst of right hand 04/08/2019   Cervical spine pain 11/03/2018   Strain of left trapezius muscle 11/03/2018   Other hyperlipidemia 09/02/2018   Onychomycosis 09/02/2018   Long-term insulin use ([place]) 09/08/2013   Type II diabetes mellitus ([place]) 08/25/2013   Hypothyroidism 08/25/2013    Past Surgical History:  Procedure Laterality Date   COLONOSCOPY WITH PROPOFOL N/A 04/24/2021   Procedure: COLONOSCOPY WITH PROPOFOL;  Surgeon: Annamaria Helling, DO;  Location: [facility] ENDOSCOPY;  Service: Gastroenterology;  Laterality: N/A;  IDDM   INGUINAL HERNIA REPAIR Bilateral 2005    Prior to Admission medications   Medication Sig Start Date End Date Taking? Authorizing Provider  aspirin 325 MG tablet aspirin 325 mg tablet   Yes [provider]  Continuous Blood Gluc Receiver (FREESTYLE LIBRE 2 READER) DEVI Use As Directed 12/26/20  Yes Sable Feil, PA-C  Continuous Blood Gluc Sensor (FREESTYLE LIBRE 2 SENSOR) MISC 1 Device by Does not apply route every 14 (fourteen) days. 12/13/20  Yes Sable Feil, PA-C  Continuous Blood Gluc Sensor (FREESTYLE LIBRE 2 SENSOR) MISC Apply 1  Device topically every 14 (fourteen) days. 07/20/21  Yes Sable Feil, PA-C  glucose blood (CONTOUR NEXT TEST) test strip TEST 4 TIMES DAILY 06/18/16  Yes [provider]  glucose blood (FREESTYLE LITE) test strip Use as instructed 10/25/20  Yes Sable Feil, PA-C  insulin aspart (NOVOLOG FLEXPEN) 100 UNIT/ML FlexPen INJECT 5 TO 7 UNITS UNDER THE SKIN THREE TIMES A DAY BEFORE MEALS** USE 1:15 SLIDING SCALE IF >180 06/06/21  Yes Sable Feil, PA-C  Insulin Glargine (BASAGLAR KWIKPEN) 100 UNIT/ML Inject 15 Units into the skin daily. Patient taking differently: Inject 17 Units into the skin daily. 04/04/20  Yes Cecil Cobbs, MD  Insulin Glargine [facility] KWIKPEN) 100 UNIT/ML Inject 17 Units into the skin daily. 06/06/21  Yes Sable Feil, PA-C  Insulin Pen Needle (BD PEN NEEDLE NANO 2ND GEN) 32G X 4 MM MISC Use With Insulin Pen Four Times A Day 03/02/20  Yes Cecil Cobbs, MD  levothyroxine (SYNTHROID) 125 MCG tablet Take 1 tablet (125 mcg total) by mouth daily. 06/06/21  Yes Sable Feil, PA-C  metFORMIN (GLUCOPHAGE) 1000 MG tablet Take 1 tablet (1,000 mg total) by mouth 2 (two) times daily. 05/11/21  Yes Sable Feil, PA-C  Omega-3 Fatty Acids (FISH OIL PO) Take 1 capsule by mouth daily.   Yes [provider]  ciclopirox (PENLAC) 8 % solution Apply topically at bedtime. Apply over nail and surrounding skin.  After seven (7) days, may remove with  alcohol and continue cycle. Patient not taking: Reported on 09/06/2021 09/02/19   Sable Feil, PA-C  gabapentin (NEURONTIN) 100 MG capsule Take by mouth. Patient not taking: Reported on 09/06/2021 04/04/21   [provider]  GVOKE HYPOPEN 2-PACK 1 MG/0.2ML SOAJ Inject into the skin. 11/23/20   [provider]  meloxicam (MOBIC) 15 MG tablet Take 1 tablet by mouth daily as needed. Patient not taking: Reported on 09/06/2021 10/06/17   [provider]  meloxicam (MOBIC) 7.5 MG tablet Take 7.5 mg by mouth 2  (two) times daily. Patient not taking: Reported on 09/06/2021 09/14/19   [provider]  Pneumococcal Vac Polyvalent (PNEUMOVAX 23 IJ) Pneumovax-23 25 mcg/0.5 mL injection syringe Patient not taking: Reported on 09/06/2021    [provider]  polyethylene glycol-electrolytes (NULYTELY) 420 g solution TAKE AS DIRECTED FOR COLON PREP. Patient not taking: Reported on 09/06/2021 11/04/20   [provider]    Allergies Patient has no known allergies.  Family History  Problem Relation Age of Onset   Diabetes Father    Diabetes Maternal Grandmother     Social History Social History   Tobacco Use   Smoking status: Former    Types: Cigarettes    Quit date: 1988    Years since quitting: 35.5   Smokeless tobacco: Former    Types: Nurse, children's Use: Never used  Substance Use Topics   Alcohol use: Yes   Drug use: Never    Review of Systems Constitutional: No fever/chills Eyes: No visual changes. ENT: No sore throat. Cardiovascular: Denies chest pain. Respiratory: Denies shortness of breath. Gastrointestinal: No abdominal pain.  No nausea, no vomiting.  No diarrhea.  No constipation. Genitourinary: Negative for dysuria. Musculoskeletal: Left shoulder pain.   Skin: Negative for rash. Neurological: Negative for headaches, focal weakness or numbness. Endocrine: Diabetes and hypothyroidism  ____________________________________________   PHYSICAL EXAM:  VITAL SIGNS: BP is 100/62, pulse 64, respirations 16, temp 97.4, patient 98% O2 sat on room air.  Patient weighs 170 pounds BMI is 21.88. Constitutional: Alert and oriented. Well appearing and in no acute distress. Eyes: Conjunctivae are normal. PERRL. EOMI. Head: Atraumatic. Nose: No congestion/rhinnorhea. Mouth/Throat: Mucous membranes are moist.  Oropharynx non-erythematous. Neck: No stridor.  No cervical spine tenderness to palpation. Hematological/Lymphatic/Immunilogical: No cervical  lymphadenopathy. Cardiovascular: Normal rate, regular rhythm. Grossly normal heart sounds.  Good peripheral circulation. Respiratory: Normal respiratory effort.  No retractions. Lungs CTAB. Gastrointestinal: Soft and nontender. No distention. No abdominal bruits. No CVA tenderness. Genitourinary: Deferred Musculoskeletal: No lower extremity tenderness nor edema.  No joint effusions. Neurologic:  Normal speech and language. No gross focal neurologic deficits are appreciated. No gait instability. Skin:  Skin is warm, dry and intact. No rash noted. Psychiatric: Mood and affect are normal. Speech and behavior are normal.  ____________________________________________   LABS        Component Ref Range & Units 7 d ago 1 yr ago 2 yr ago 3 yr ago  Color, UA  Yellow  yellow  Yellow  Light Yellow   Clarity, UA  Clear  clear  Clear  Clear   Glucose, UA Negative Negative  Negative  Positive Abnormal  CM  Negative   Bilirubin, UA  Negative  negative  Negative  Negative   Ketones, UA  Negative  negative  Negative  Negative   Spec Grav, UA 1.010 - 1.025 1.020  1.020  >=1.030 Abnormal   1.010   Blood, UA  Negative  positive 2+  Positive CM  2+   pH, UA 5.0 - 8.0 6.0  6.0  5.5  6.0   Protein, UA Negative Negative  Negative  Negative  Negative   Urobilinogen, UA 0.2 or 1.0 E.U./dL 0.2  0.2  0.2  0.2   Nitrite, UA  Negative  negative  Negative  Negative   Leukocytes, UA Negative Negative  Negative  Negative  Negative   Appearance   medium       Odor                      View All Conversations on this Encounter                      Component Ref Range & Units 7 d ago (08/30/21) 1 yr ago (08/23/20) 1 yr ago (02/24/20) 2 yr ago (08/26/19) 3 yr ago (08/28/18)  Creatinine, Urine Not Estab. mg/dL 86.1  94.5  CANCELED R, CM  113.4  32.2   Microalbumin, Urine Not Estab. ug/mL <3.0  3.9  CANCELED R, CM  <3.0  <3.0   Microalb/Creat Ratio 0 - 29 mg/g creat <3  4 CM   <3 CM  <9 CM   Comment:                         Normal:                0 -  29                         Moderately increased: 30 - 300                         Severely increased:       >300   Resulting Agency  _0          Narrative Performed byMaryan Puls Performed at:  [facility]  [address] [place], [place]  [id number]  Lab Director: Rush Farmer MD, Phone:  [phone]    Specimen Collected: 08/30/21 09:25 Last Resulted: 08/31/21 13:10         View All Conversations on this Encounter      CM=Additional comments  R=Reference range differs from displayed range        Back to Top URINE MICROALBUMIN (Yearly)  Next due on 08/31/2022  Address Topic          Contains abnormal data Hgb A1c w/o eAG Order: [id number] Status: Final result    Visible to patient: Yes (seen)    Next appt: None    Dx: Routine adult health maintenance    0 Result Notes   1 HM Topic           Component Ref Range & Units 7 d ago (08/30/21) 6 mo ago (03/02/21) 1 yr ago (08/23/20) 1 yr ago (02/24/20) 2 yr ago (08/26/19) 2 yr ago (06/03/19) 2 yr ago (03/10/19)  Hgb A1c MFr Bld 4.8 - 5.6 % 6.1 High   5.4 CM  6.1 High  CM  5.7 High  CM  6.4 High  CM  5.7 Abnormal  R  6.0 High  CM   Comment:          Prediabetes: 5.7 - 6.4           Diabetes: >6.4  Glycemic control for adults with diabetes: <7.0   HbA1c POC (<> result, manual entry)           Resulting Agency  _0   [facility]         Narrative Performed by: Maryan Puls Performed at:  [facility]  [address] [place], [place]  [id number]  Lab Director: Rush Farmer MD, Phone:  [phone]    Specimen Collected: 08/30/21 08:54 Last Resulted: 08/31/21 08:18        View All Conversations on this Encounter                Contains abnormal data CMP12+LP+TP+TSH+6AC+PSA+CBC. Order: [id number] Status: Final result    Visible to patient: Yes (seen)    Next appt: None    Dx: Routine  adult health maintenance    0 Result Notes           Component Ref Range & Units 7 d ago (08/30/21) 6 mo ago (03/02/21) 1 yr ago (08/23/20) 1 yr ago (02/24/20) 1 yr ago (02/24/20) 2 yr ago (08/26/19) 2 yr ago (03/10/19)  Glucose 70 - 99 mg/dL 63 Low   114 High   80 R  184 High  R   173 High  R    Uric Acid 3.8 - 8.4 mg/dL 5.0  4.8 CM  4.8 CM  3.6 Low  CM   3.4 Low  CM    Comment:            Therapeutic target for gout patients: <6.0  BUN 6 - 24 mg/dL _1 CANCELED R, CM  18  14   Creatinine, Ser 0.76 - 1.27 mg/dL 0.77  0.71 Low   0.87  0.74 Low   CANCELED R, CM  0.78  0.73 Low    eGFR >59 mL/min/1.73 108  110  104       BUN/Creatinine Ratio 9 - 20 22 High   20  24 High   23 High    23 High   19   Sodium 134 - 144 mmol/L 139  140  137  141   140    Potassium 3.5 - 5.2 mmol/L 4.3  4.3  4.4  4.1   4.4    Chloride 96 - 106 mmol/L 103  103  103  106   106    Calcium 8.7 - 10.2 mg/dL 9.4  9.5  9.4  9.2   9.1    Phosphorus 2.8 - 4.1 mg/dL 4.1  4.3 High   3.9  3.0   3.2    Total Protein 6.0 - 8.5 g/dL 6.1  5.9 Low   6.4  5.8 Low    5.9 Low     Albumin 3.8 - 4.9 g/dL 4.3  4.2  4.5  4.0   3.7 Low  R    Comment:               **Please note reference interval change**  Globulin, Total 1.5 - 4.5 g/dL 1.8  1.7  1.9  1.8   2.2    Albumin/Globulin Ratio 1.2 - 2.2 2.4 High   2.5 High   2.4 High   2.2   1.7    Bilirubin Total 0.0 - 1.2 mg/dL 0.5  0.4  0.4  0.3   0.2    Alkaline Phosphatase 44 - 121 IU/L 57  63  50  49 CM   46 Low  R  LDH 121 - 224 IU/L 137  131  132  116 Low    120 Low     AST 0 - 40 IU/L _0 ALT 0 - 44 IU/L _1 GGT 0 - 65 IU/L _2 Iron 38 - 169 ug/dL 110  111  107  77   61    Cholesterol, Total 100 - 199 mg/dL 192  168  168  192   151    Triglycerides 0 - 149 mg/dL 60  52  48  38   70    HDL >39 mg/dL 68  63  69  71   55    VLDL Cholesterol Cal 5 - 40 mg/dL _3 LDL Chol Calc (NIH) 0 - 99 mg/dL 113 High    94  89  113 High    82    Chol/HDL Ratio 0.0 - 5.0 ratio 2.8  2.7 CM  2.4 CM  2.7 CM   2.7 CM    Comment:                                   T. Chol/HDL Ratio                                              Men  Women                                1/2 Avg.Risk  3.4    3.3                                    Avg.Risk  5.0    4.4                                 2X Avg.Risk  9.6    7.1                                 3X Avg.Risk 23.4   11.0   Estimated CHD Risk 0.0 - 1.0 times avg.  < 0.5   < 0.5 CM   < 0.5 CM   < 0.5 CM    < 0.5 CM    Comment: The CHD Risk is based on the T. Chol/HDL ratio. Other  factors affect CHD Risk such as hypertension, smoking,  diabetes, severe obesity, and family history of  premature CHD.   TSH 0.450 - 4.500 uIU/mL 0.266 Low   0.010 Low   4.800 High   4.720 High    4.020    T4, Total 4.5 - 12.0 ug/dL 11.6  10.1  8.2  7.8   7.6    T3 Uptake Ratio 24 - 39 % _4 Free Thyroxine Index  1.2 - 4.9 3.4  2.9  2.0  2.2   2.1    Prostate Specific Ag, Serum 0.0 - 4.0 ng/mL 0.7   0.8 CM  0.6 CM   0.8 CM    Comment: Roche ECLIA methodology.  According to the American Urological Association, Serum PSA should  decrease and remain at undetectable levels after radical  prostatectomy. The AUA defines biochemical recurrence as an initial  PSA value 0.2 ng/mL or greater followed by a subsequent confirmatory  PSA value 0.2 ng/mL or greater.  Values obtained with different assay methods or kits cannot be used  interchangeably. Results cannot be interpreted as absolute evidence  of the presence or absence of malignant disease.   WBC 3.4 - 10.8 x10E3/uL 5.1  5.5  5.8  5.2   5.7    RBC 4.14 - 5.80 x10E6/uL 4.77  4.89  4.97  4.87   4.88    Hemoglobin 13.0 - 17.7 g/dL 14.8  15.0  14.9  14.6   14.9    Hematocrit 37.5 - 51.0 % 43.0  41.7  44.6  43.5   44.3    MCV 79 - 97 fL 90  85  90  89   91    MCH 26.6 - 33.0 pg 31.0  30.7  30.0  30.0   30.5    MCHC 31.5 - 35.7 g/dL 34.4   36.0 High   33.4  33.6   33.6    RDW 11.6 - 15.4 % 12.7  12.9  12.7  13.0   12.7    Platelets 150 - 450 x10E3/uL 285  343  266  279   255    Neutrophils Not Estab. % 56  58  57  55   55    Lymphs Not Estab. % _0 32   32    Monocytes Not Estab. % _1 Eos Not Estab. % _2 Basos Not Estab. % _3 Neutrophils Absolute 1.4 - 7.0 x10E3/uL 2.9  3.2  3.3  2.9   3.2    Lymphocytes Absolute 0.7 - 3.1 x10E3/uL 1.6  1.6  1.6  1.7   1.8    Monocytes Absolute 0.1 - 0.9 x10E3/uL 0.4  0.5  0.4  0.4   0.4    EOS (ABSOLUTE) 0.0 - 0.4 x10E3/uL 0.2  0.1  0.3  0.2   0.3    Basophils Absolute 0.0 - 0.2 x10E3/uL 0.0  0.0  0.0  0.0   0.0    Immature Granulocytes Not Estab. % 0  0  0  1   0    Immature Grans              ______________________________________  EKG  Sinus  Bradycardia at 56 bpm -RSR(V1) -nondiagnostic.   -Left atrial enlargement.   BORDERLINE ____________________________________________    ____________________________________________   INITIAL IMPRESSION / ASSESSMENT AND PLAN As part of my medical decision making, I reviewed the following data within the [facility]       Discussed labs and EKG findings with patient.      ____________________________________________   FINAL CLINICAL IMPRESSION Well exam   ED Discharge Orders     None        Note:  This document was  prepared using Systems analyst and may include unintentional dictation errors.

## 2021-09-11 ENCOUNTER — Other Ambulatory Visit: Payer: Self-pay

## 2021-09-11 ENCOUNTER — Encounter: Payer: Self-pay | Admitting: Physician Assistant

## 2021-09-11 DIAGNOSIS — M9902 Segmental and somatic dysfunction of thoracic region: Secondary | ICD-10-CM | POA: Diagnosis not present

## 2021-09-11 DIAGNOSIS — E109 Type 1 diabetes mellitus without complications: Secondary | ICD-10-CM

## 2021-09-11 DIAGNOSIS — M9903 Segmental and somatic dysfunction of lumbar region: Secondary | ICD-10-CM | POA: Diagnosis not present

## 2021-09-11 DIAGNOSIS — M5412 Radiculopathy, cervical region: Secondary | ICD-10-CM | POA: Diagnosis not present

## 2021-09-11 DIAGNOSIS — M9901 Segmental and somatic dysfunction of cervical region: Secondary | ICD-10-CM | POA: Diagnosis not present

## 2021-09-11 MED ORDER — FREESTYLE LITE TEST VI STRP: ORAL_STRIP | 12 refills | Status: DC

## 2021-09-11 NOTE — Telephone Encounter (Signed)
Message from [place]:  Kirkland [facility] Phone Number: [phone]   I have been using my test strips here lately because I've been getting wild readings with my sensor so I've been using these test strips everyday and I'm getting close to running out and I tried to reorder test strips and the pharmacy is telling me it's too soon and I talked to a lady at [facility] [employer] and it does not have anything about testing three or four times a day she told me it was saying once a day so she told me I would need a new prescription for the freestyle libre test strips where I can test at least three times a day   AMD

## 2021-09-25 DIAGNOSIS — M9903 Segmental and somatic dysfunction of lumbar region: Secondary | ICD-10-CM | POA: Diagnosis not present

## 2021-09-25 DIAGNOSIS — M9902 Segmental and somatic dysfunction of thoracic region: Secondary | ICD-10-CM | POA: Diagnosis not present

## 2021-09-25 DIAGNOSIS — M9901 Segmental and somatic dysfunction of cervical region: Secondary | ICD-10-CM | POA: Diagnosis not present

## 2021-09-25 DIAGNOSIS — M5412 Radiculopathy, cervical region: Secondary | ICD-10-CM | POA: Diagnosis not present

## 2021-10-13 DIAGNOSIS — M5412 Radiculopathy, cervical region: Secondary | ICD-10-CM | POA: Diagnosis not present

## 2021-10-13 DIAGNOSIS — M9902 Segmental and somatic dysfunction of thoracic region: Secondary | ICD-10-CM | POA: Diagnosis not present

## 2021-10-13 DIAGNOSIS — M9903 Segmental and somatic dysfunction of lumbar region: Secondary | ICD-10-CM | POA: Diagnosis not present

## 2021-10-13 DIAGNOSIS — M9901 Segmental and somatic dysfunction of cervical region: Secondary | ICD-10-CM | POA: Diagnosis not present

## 2021-10-31 DIAGNOSIS — M9901 Segmental and somatic dysfunction of cervical region: Secondary | ICD-10-CM | POA: Diagnosis not present

## 2021-10-31 DIAGNOSIS — M9902 Segmental and somatic dysfunction of thoracic region: Secondary | ICD-10-CM | POA: Diagnosis not present

## 2021-10-31 DIAGNOSIS — M9903 Segmental and somatic dysfunction of lumbar region: Secondary | ICD-10-CM | POA: Diagnosis not present

## 2021-10-31 DIAGNOSIS — M5412 Radiculopathy, cervical region: Secondary | ICD-10-CM | POA: Diagnosis not present

## 2021-11-03 DIAGNOSIS — M9902 Segmental and somatic dysfunction of thoracic region: Secondary | ICD-10-CM | POA: Diagnosis not present

## 2021-11-03 DIAGNOSIS — M5412 Radiculopathy, cervical region: Secondary | ICD-10-CM | POA: Diagnosis not present

## 2021-11-03 DIAGNOSIS — M9903 Segmental and somatic dysfunction of lumbar region: Secondary | ICD-10-CM | POA: Diagnosis not present

## 2021-11-03 DIAGNOSIS — M9901 Segmental and somatic dysfunction of cervical region: Secondary | ICD-10-CM | POA: Diagnosis not present

## 2021-11-13 DIAGNOSIS — M9901 Segmental and somatic dysfunction of cervical region: Secondary | ICD-10-CM | POA: Diagnosis not present

## 2021-11-13 DIAGNOSIS — M9902 Segmental and somatic dysfunction of thoracic region: Secondary | ICD-10-CM | POA: Diagnosis not present

## 2021-11-13 DIAGNOSIS — M9903 Segmental and somatic dysfunction of lumbar region: Secondary | ICD-10-CM | POA: Diagnosis not present

## 2021-11-13 DIAGNOSIS — M5412 Radiculopathy, cervical region: Secondary | ICD-10-CM | POA: Diagnosis not present

## 2021-11-15 ENCOUNTER — Encounter: Payer: Self-pay | Admitting: Physician Assistant

## 2021-11-15 ENCOUNTER — Other Ambulatory Visit: Payer: Self-pay | Admitting: Physician Assistant

## 2021-11-15 MED ORDER — BASAGLAR KWIKPEN 100 UNIT/ML ~~LOC~~ SOPN: 17.0000 [IU] | PEN_INJECTOR | Freq: Every day | SUBCUTANEOUS | 1 refills | Status: DC

## 2021-11-16 ENCOUNTER — Other Ambulatory Visit: Payer: Self-pay

## 2021-11-27 DIAGNOSIS — M9902 Segmental and somatic dysfunction of thoracic region: Secondary | ICD-10-CM | POA: Diagnosis not present

## 2021-11-27 DIAGNOSIS — M5412 Radiculopathy, cervical region: Secondary | ICD-10-CM | POA: Diagnosis not present

## 2021-11-27 DIAGNOSIS — M9901 Segmental and somatic dysfunction of cervical region: Secondary | ICD-10-CM | POA: Diagnosis not present

## 2021-11-27 DIAGNOSIS — M9903 Segmental and somatic dysfunction of lumbar region: Secondary | ICD-10-CM | POA: Diagnosis not present

## 2021-12-15 DIAGNOSIS — M9902 Segmental and somatic dysfunction of thoracic region: Secondary | ICD-10-CM | POA: Diagnosis not present

## 2021-12-15 DIAGNOSIS — M9903 Segmental and somatic dysfunction of lumbar region: Secondary | ICD-10-CM | POA: Diagnosis not present

## 2021-12-15 DIAGNOSIS — M5412 Radiculopathy, cervical region: Secondary | ICD-10-CM | POA: Diagnosis not present

## 2021-12-15 DIAGNOSIS — M9901 Segmental and somatic dysfunction of cervical region: Secondary | ICD-10-CM | POA: Diagnosis not present

## 2021-12-25 DIAGNOSIS — M5412 Radiculopathy, cervical region: Secondary | ICD-10-CM | POA: Diagnosis not present

## 2021-12-25 DIAGNOSIS — M9903 Segmental and somatic dysfunction of lumbar region: Secondary | ICD-10-CM | POA: Diagnosis not present

## 2021-12-25 DIAGNOSIS — M9902 Segmental and somatic dysfunction of thoracic region: Secondary | ICD-10-CM | POA: Diagnosis not present

## 2021-12-25 DIAGNOSIS — M9901 Segmental and somatic dysfunction of cervical region: Secondary | ICD-10-CM | POA: Diagnosis not present

## 2022-01-08 DIAGNOSIS — M9901 Segmental and somatic dysfunction of cervical region: Secondary | ICD-10-CM | POA: Diagnosis not present

## 2022-01-08 DIAGNOSIS — M9902 Segmental and somatic dysfunction of thoracic region: Secondary | ICD-10-CM | POA: Diagnosis not present

## 2022-01-08 DIAGNOSIS — M5412 Radiculopathy, cervical region: Secondary | ICD-10-CM | POA: Diagnosis not present

## 2022-01-08 DIAGNOSIS — M9903 Segmental and somatic dysfunction of lumbar region: Secondary | ICD-10-CM | POA: Diagnosis not present

## 2022-01-22 DIAGNOSIS — M9903 Segmental and somatic dysfunction of lumbar region: Secondary | ICD-10-CM | POA: Diagnosis not present

## 2022-01-22 DIAGNOSIS — M5412 Radiculopathy, cervical region: Secondary | ICD-10-CM | POA: Diagnosis not present

## 2022-01-22 DIAGNOSIS — M9901 Segmental and somatic dysfunction of cervical region: Secondary | ICD-10-CM | POA: Diagnosis not present

## 2022-01-22 DIAGNOSIS — M9902 Segmental and somatic dysfunction of thoracic region: Secondary | ICD-10-CM | POA: Diagnosis not present

## 2022-01-31 ENCOUNTER — Ambulatory Visit: Payer: Self-pay | Admitting: Physician Assistant

## 2022-01-31 ENCOUNTER — Encounter: Payer: Self-pay | Admitting: Physician Assistant

## 2022-01-31 DIAGNOSIS — J09X2 Influenza due to identified novel influenza A virus with other respiratory manifestations: Secondary | ICD-10-CM

## 2022-01-31 DIAGNOSIS — R6889 Other general symptoms and signs: Secondary | ICD-10-CM

## 2022-01-31 LAB — POCT INFLUENZA A/B
Influenza A, POC: POSITIVE — AB
Influenza B, POC: NEGATIVE

## 2022-01-31 LAB — POC COVID19 BINAXNOW: SARS Coronavirus 2 Ag: NEGATIVE

## 2022-01-31 MED ORDER — OSELTAMIVIR PHOSPHATE 75 MG PO CAPS: 75.0000 mg | ORAL_CAPSULE | Freq: Two times a day (BID) | ORAL | 0 refills | Status: DC

## 2022-01-31 MED ORDER — IBUPROFEN 800 MG PO TABS: 800.0000 mg | ORAL_TABLET | Freq: Three times a day (TID) | ORAL | 0 refills | Status: DC | PRN

## 2022-01-31 MED ORDER — FEXOFENADINE-PSEUDOEPHED ER 60-120 MG PO TB12: 1.0000 | ORAL_TABLET | Freq: Two times a day (BID) | ORAL | 0 refills | Status: DC

## 2022-01-31 MED ORDER — BENZONATATE 200 MG PO CAPS: 200.0000 mg | ORAL_CAPSULE | Freq: Three times a day (TID) | ORAL | 0 refills | Status: DC | PRN

## 2022-01-31 NOTE — Progress Notes (Signed)
   Subjective: Influenza    Patient ID: Paul Jacobs, male    DOB: 11/14/1970, 51 y.o.   MRN: [id number]  HPI Patient complains of 3 days of nasal congestion, cough, body aches, and fever.  Denies recent travel or known contact with COVID-19.  Patient tested positive for influenza A today.  Patient tested negative for COVID-19.   Review of Systems Diabetes,    Objective:   Physical Exam Deferred due to this being a telephonic encounter       Assessment & Plan: Influenza A   Patient given a prescription for Tamiflu, Allegra-D, Tessalon Perles, and ibuprofen.  Patient advised increase fluid intake and follow-up if no improvement in 3 to 5 days.

## 2022-01-31 NOTE — Progress Notes (Signed)
Pt presents today with flu like symptoms, cough, nasal drainage, head ache, fever and body aches 01-28-22. Pt states cough started at the end of October on and off.   Positive for Flu type A.

## 2022-02-05 ENCOUNTER — Encounter: Payer: Self-pay | Admitting: Physician Assistant

## 2022-02-05 ENCOUNTER — Other Ambulatory Visit: Payer: Self-pay | Admitting: Physician Assistant

## 2022-02-05 DIAGNOSIS — M9903 Segmental and somatic dysfunction of lumbar region: Secondary | ICD-10-CM | POA: Diagnosis not present

## 2022-02-05 DIAGNOSIS — M9902 Segmental and somatic dysfunction of thoracic region: Secondary | ICD-10-CM | POA: Diagnosis not present

## 2022-02-05 DIAGNOSIS — M9901 Segmental and somatic dysfunction of cervical region: Secondary | ICD-10-CM | POA: Diagnosis not present

## 2022-02-05 DIAGNOSIS — M5412 Radiculopathy, cervical region: Secondary | ICD-10-CM | POA: Diagnosis not present

## 2022-02-05 MED ORDER — LANTUS SOLOSTAR 100 UNIT/ML ~~LOC~~ SOPN: 15.0000 [IU] | PEN_INJECTOR | Freq: Every day | SUBCUTANEOUS | 99 refills | Status: DC

## 2022-02-26 DIAGNOSIS — M5412 Radiculopathy, cervical region: Secondary | ICD-10-CM | POA: Diagnosis not present

## 2022-02-26 DIAGNOSIS — M9901 Segmental and somatic dysfunction of cervical region: Secondary | ICD-10-CM | POA: Diagnosis not present

## 2022-02-26 DIAGNOSIS — M9902 Segmental and somatic dysfunction of thoracic region: Secondary | ICD-10-CM | POA: Diagnosis not present

## 2022-02-26 DIAGNOSIS — M9903 Segmental and somatic dysfunction of lumbar region: Secondary | ICD-10-CM | POA: Diagnosis not present

## 2022-02-27 ENCOUNTER — Other Ambulatory Visit: Payer: Self-pay | Admitting: Physician Assistant

## 2022-02-27 DIAGNOSIS — Z794 Long term (current) use of insulin: Secondary | ICD-10-CM | POA: Diagnosis not present

## 2022-02-27 DIAGNOSIS — E109 Type 1 diabetes mellitus without complications: Secondary | ICD-10-CM | POA: Diagnosis not present

## 2022-02-27 DIAGNOSIS — H02412 Mechanical ptosis of left eyelid: Secondary | ICD-10-CM | POA: Diagnosis not present

## 2022-02-27 DIAGNOSIS — H40033 Anatomical narrow angle, bilateral: Secondary | ICD-10-CM | POA: Diagnosis not present

## 2022-02-27 DIAGNOSIS — H524 Presbyopia: Secondary | ICD-10-CM | POA: Diagnosis not present

## 2022-02-27 MED ORDER — LANTUS SOLOSTAR 100 UNIT/ML ~~LOC~~ SOPN: 15.0000 [IU] | PEN_INJECTOR | Freq: Every day | SUBCUTANEOUS | 99 refills | Status: DC

## 2022-03-08 ENCOUNTER — Other Ambulatory Visit: Payer: Self-pay | Admitting: Physician Assistant

## 2022-03-08 DIAGNOSIS — J020 Streptococcal pharyngitis: Secondary | ICD-10-CM

## 2022-03-08 DIAGNOSIS — R0982 Postnasal drip: Secondary | ICD-10-CM

## 2022-03-08 LAB — POCT RAPID STREP A (OFFICE): Rapid Strep A Screen: NEGATIVE

## 2022-03-08 MED ORDER — LIDOCAINE VISCOUS HCL 2 % MT SOLN: 15.0000 mL | OROMUCOSAL | 0 refills | Status: DC | PRN

## 2022-03-08 MED ORDER — METHYLPREDNISOLONE 4 MG PO TBPK: ORAL_TABLET | ORAL | 0 refills | Status: DC

## 2022-03-08 NOTE — Progress Notes (Signed)
Pt recently recovered from flu, Pt stating pass couple of days throat has been sore and scratchy feeling like something is stuck.

## 2022-03-08 NOTE — Progress Notes (Signed)
   Subjective: Sore throat    Patient ID: Paul Jacobs, male    DOB: 1971-02-08, 52 y.o.   MRN: [id number]  HPI Patient complain of sore throat for 2 days.  Denies recent travel or known contact with COVID-19.  Patient tested negative for strep pharyngitis today.   Review of Systems Diabetes, hyperlipidemia, and hypothyroidism.    Objective:   Physical Exam  Deferred secondary to telephonic encounter      Assessment & Plan: Viral pharyngitis   Patient had a prescription for viscous lidocaine for swish and swallow.  Advised supportive care follow-up with no improvement in 4 days.

## 2022-03-12 ENCOUNTER — Other Ambulatory Visit: Payer: Self-pay

## 2022-03-12 DIAGNOSIS — E291 Testicular hypofunction: Secondary | ICD-10-CM

## 2022-03-12 DIAGNOSIS — E109 Type 1 diabetes mellitus without complications: Secondary | ICD-10-CM

## 2022-03-12 NOTE — Progress Notes (Signed)
Pt presents today to complete 6 month labs.

## 2022-03-13 DIAGNOSIS — M9903 Segmental and somatic dysfunction of lumbar region: Secondary | ICD-10-CM | POA: Diagnosis not present

## 2022-03-13 DIAGNOSIS — M9901 Segmental and somatic dysfunction of cervical region: Secondary | ICD-10-CM | POA: Diagnosis not present

## 2022-03-13 DIAGNOSIS — M9902 Segmental and somatic dysfunction of thoracic region: Secondary | ICD-10-CM | POA: Diagnosis not present

## 2022-03-13 DIAGNOSIS — M5412 Radiculopathy, cervical region: Secondary | ICD-10-CM | POA: Diagnosis not present

## 2022-03-14 LAB — CMP12+LP+TP+TSH+6AC+PSA+CBC…
ALT: 18 IU/L (ref 0–44)
AST: 18 IU/L (ref 0–40)
Albumin/Globulin Ratio: 2 (ref 1.2–2.2)
Albumin: 4.2 g/dL (ref 3.8–4.9)
Alkaline Phosphatase: 63 IU/L (ref 44–121)
BUN/Creatinine Ratio: 22 — ABNORMAL HIGH (ref 9–20)
BUN: 18 mg/dL (ref 6–24)
Basophils Absolute: 0.1 10*3/uL (ref 0.0–0.2)
Basos: 1 %
Bilirubin Total: 0.5 mg/dL (ref 0.0–1.2)
Calcium: 9.2 mg/dL (ref 8.7–10.2)
Chloride: 101 mmol/L (ref 96–106)
Chol/HDL Ratio: 2.1 ratio (ref 0.0–5.0)
Cholesterol, Total: 165 mg/dL (ref 100–199)
Creatinine, Ser: 0.82 mg/dL (ref 0.76–1.27)
EOS (ABSOLUTE): 0.1 10*3/uL (ref 0.0–0.4)
Eos: 1 %
Estimated CHD Risk: <0.5 times avg. (ref 0.0–1.0)
Free Thyroxine Index: 2.9 (ref 1.2–4.9)
GGT: 6 IU/L (ref 0–65)
Globulin, Total: 2.1 g/dL (ref 1.5–4.5)
Glucose: 225 mg/dL — ABNORMAL HIGH (ref 70–99)
HDL: 77 mg/dL (ref >39)
Hematocrit: 41.8 % (ref 37.5–51.0)
Hemoglobin: 14.3 g/dL (ref 13.0–17.7)
Immature Grans (Abs): 0 10*3/uL (ref 0.0–0.1)
Immature Granulocytes: 0 %
Iron: 126 ug/dL (ref 38–169)
LDH: 188 IU/L (ref 121–224)
LDL Chol Calc (NIH): 80 mg/dL (ref 0–99)
Lymphocytes Absolute: 2.3 10*3/uL (ref 0.7–3.1)
Lymphs: 29 %
MCH: 30 pg (ref 26.6–33.0)
MCHC: 34.2 g/dL (ref 31.5–35.7)
MCV: 88 fL (ref 79–97)
Monocytes Absolute: 0.5 10*3/uL (ref 0.1–0.9)
Monocytes: 6 %
Neutrophils Absolute: 5.1 10*3/uL (ref 1.4–7.0)
Neutrophils: 63 %
Phosphorus: 3.1 mg/dL (ref 2.8–4.1)
Platelets: 305 10*3/uL (ref 150–450)
Potassium: 4.7 mmol/L (ref 3.5–5.2)
Prostate Specific Ag, Serum: 0.7 ng/mL (ref 0.0–4.0)
RBC: 4.77 x10E6/uL (ref 4.14–5.80)
RDW: 12.6 % (ref 11.6–15.4)
Sodium: 138 mmol/L (ref 134–144)
T3 Uptake Ratio: 28 % (ref 24–39)
T4, Total: 10.5 ug/dL (ref 4.5–12.0)
TSH: 0.249 u[IU]/mL — ABNORMAL LOW (ref 0.450–4.500)
Total Protein: 6.3 g/dL (ref 6.0–8.5)
Triglycerides: 35 mg/dL (ref 0–149)
Uric Acid: 3.7 mg/dL — ABNORMAL LOW (ref 3.8–8.4)
VLDL Cholesterol Cal: 8 mg/dL (ref 5–40)
WBC: 8 10*3/uL (ref 3.4–10.8)
eGFR: 105 mL/min/{1.73_m2} (ref >59)

## 2022-03-14 LAB — MICROALBUMIN / CREATININE URINE RATIO
Creatinine, Urine: 112.5 mg/dL
Microalb/Creat Ratio: 12 mg/g creat (ref 0–29)
Microalbumin, Urine: 14 ug/mL

## 2022-03-14 LAB — HGB A1C W/O EAG: Hgb A1c MFr Bld: 6.2 % — ABNORMAL HIGH (ref 4.8–5.6)

## 2022-03-16 LAB — TESTOSTERONE,FREE AND TOTAL
Testosterone, Free: 8.4 pg/mL (ref 7.2–24.0)
Testosterone: 709 ng/dL (ref 264–916)

## 2022-03-16 LAB — SPECIMEN STATUS REPORT

## 2022-03-27 DIAGNOSIS — M5412 Radiculopathy, cervical region: Secondary | ICD-10-CM | POA: Diagnosis not present

## 2022-03-27 DIAGNOSIS — M9903 Segmental and somatic dysfunction of lumbar region: Secondary | ICD-10-CM | POA: Diagnosis not present

## 2022-03-27 DIAGNOSIS — M9901 Segmental and somatic dysfunction of cervical region: Secondary | ICD-10-CM | POA: Diagnosis not present

## 2022-03-27 DIAGNOSIS — M9902 Segmental and somatic dysfunction of thoracic region: Secondary | ICD-10-CM | POA: Diagnosis not present

## 2022-04-10 DIAGNOSIS — M9902 Segmental and somatic dysfunction of thoracic region: Secondary | ICD-10-CM | POA: Diagnosis not present

## 2022-04-10 DIAGNOSIS — M9903 Segmental and somatic dysfunction of lumbar region: Secondary | ICD-10-CM | POA: Diagnosis not present

## 2022-04-10 DIAGNOSIS — M9901 Segmental and somatic dysfunction of cervical region: Secondary | ICD-10-CM | POA: Diagnosis not present

## 2022-04-10 DIAGNOSIS — M5412 Radiculopathy, cervical region: Secondary | ICD-10-CM | POA: Diagnosis not present

## 2022-04-24 DIAGNOSIS — M9903 Segmental and somatic dysfunction of lumbar region: Secondary | ICD-10-CM | POA: Diagnosis not present

## 2022-04-24 DIAGNOSIS — M5412 Radiculopathy, cervical region: Secondary | ICD-10-CM | POA: Diagnosis not present

## 2022-04-24 DIAGNOSIS — M9902 Segmental and somatic dysfunction of thoracic region: Secondary | ICD-10-CM | POA: Diagnosis not present

## 2022-04-24 DIAGNOSIS — M9901 Segmental and somatic dysfunction of cervical region: Secondary | ICD-10-CM | POA: Diagnosis not present

## 2022-04-29 ENCOUNTER — Other Ambulatory Visit: Payer: Self-pay | Admitting: Physician Assistant

## 2022-04-29 DIAGNOSIS — E119 Type 2 diabetes mellitus without complications: Secondary | ICD-10-CM

## 2022-04-30 ENCOUNTER — Encounter: Payer: Self-pay | Admitting: Physician Assistant

## 2022-04-30 ENCOUNTER — Other Ambulatory Visit: Payer: Self-pay | Admitting: Physician Assistant

## 2022-04-30 ENCOUNTER — Ambulatory Visit: Payer: Self-pay | Admitting: Physician Assistant

## 2022-04-30 DIAGNOSIS — W57XXXA Bitten or stung by nonvenomous insect and other nonvenomous arthropods, initial encounter: Secondary | ICD-10-CM

## 2022-04-30 MED ORDER — DOXYCYCLINE MONOHYDRATE 100 MG PO CAPS: 100.0000 mg | ORAL_CAPSULE | Freq: Two times a day (BID) | ORAL | 0 refills | Status: DC

## 2022-04-30 MED ORDER — CLOBETASOL PROPIONATE 0.05 % EX CREA: 1.0000 | TOPICAL_CREAM | Freq: Two times a day (BID) | CUTANEOUS | 0 refills | Status: DC

## 2022-04-30 NOTE — Progress Notes (Signed)
Pt has three tick bites. The main one he is concerned about is right at the right arm pit, red fire ring around it. Pt states it may have got on him Friday 04-27-22 but pulled it off (found it) Saturday the next day.

## 2022-04-30 NOTE — Progress Notes (Signed)
   Subjective: Tick bite right axillary    Patient ID: Paul Jacobs, male    DOB: November 01, 1970, 52 y.o.   MRN: [id number]  HPI Patient is a day for reevaluation for tick bite which occurred on 04/27/2022.  Patient stated he removed intact tick on 04/28/2022.  Presents with a papular lesion on the cervical erythematous border.  No fever chills associated complaint.   Review of Systems Diabetes and hypothyroidism.    Objective:   Physical Exam  See nurses note for vital signs Patient has a papular lesion on erythematous base.  No lymphadenopathy.     Assessment & Plan: Tick bite   Patient placed on prophylactic regimen of doxycycline 100 mg twice daily.  Patient will follow-up in 1 week.  Labs deferred.

## 2022-05-06 ENCOUNTER — Encounter: Payer: Self-pay | Admitting: Physician Assistant

## 2022-05-07 ENCOUNTER — Other Ambulatory Visit: Payer: Self-pay

## 2022-05-07 ENCOUNTER — Other Ambulatory Visit: Payer: Self-pay | Admitting: Physician Assistant

## 2022-05-07 DIAGNOSIS — W57XXXD Bitten or stung by nonvenomous insect and other nonvenomous arthropods, subsequent encounter: Secondary | ICD-10-CM

## 2022-05-07 DIAGNOSIS — S0096XD Insect bite (nonvenomous) of unspecified part of head, subsequent encounter: Secondary | ICD-10-CM

## 2022-05-07 DIAGNOSIS — R509 Fever, unspecified: Secondary | ICD-10-CM

## 2022-05-07 DIAGNOSIS — R52 Pain, unspecified: Secondary | ICD-10-CM

## 2022-05-07 LAB — POC COVID19 BINAXNOW: SARS Coronavirus 2 Ag: NEGATIVE

## 2022-05-08 ENCOUNTER — Encounter: Payer: Self-pay | Admitting: Physician Assistant

## 2022-05-08 ENCOUNTER — Ambulatory Visit: Payer: Self-pay | Admitting: Physician Assistant

## 2022-05-08 DIAGNOSIS — M791 Myalgia, unspecified site: Secondary | ICD-10-CM

## 2022-05-08 LAB — LYME DISEASE SEROLOGY W/REFLEX: Lyme Total Antibody EIA: NEGATIVE

## 2022-05-08 NOTE — Progress Notes (Signed)
   Subjective: Myalgia status post tick bite    Patient ID: Paul Jacobs, male    DOB: 1970/09/12, 52 y.o.   MRN: [id number]  HPI Patient complains of joint pain and fever status post tick bite 3-9 2024.  Patient was placed on prophylactic regimen of doxycycline 8 days ago.  Patient states 3 days ago he developed joint pain and fever.  Patient states temperature as high as 101.  Patient states currently taking ibuprofen.  Patient states uncomfortable throughout the day and is not sleeping well since 05/04/2022.  Labs are pending for Lyme disease and [facility] spotted fever.  Patient continues doxycycline.  Review of Systems Diabetes, hyperlipidemia, and hypothyroidism    Objective:   Physical Exam Vital signs not taken. Physical exam was deferred.      Assessment & Plan: Joint pain status post tick bite   Patient follow-up consistent drawing labs for CBC, ANA, and sed rate.  Advised to continue doxycycline.  Patient will follow-up in 2 days.

## 2022-05-08 NOTE — Progress Notes (Signed)
Pt presents today for follow up on tick bite, Labs are still pending but pt states he's still taking the ibuprofen for the aches. Burna Sis

## 2022-05-09 LAB — CBC WITH DIFFERENTIAL/PLATELET
Basophils Absolute: 0 10*3/uL (ref 0.0–0.2)
Basos: 0 %
EOS (ABSOLUTE): 0.3 10*3/uL (ref 0.0–0.4)
Eos: 12 %
Hematocrit: 39.8 % (ref 37.5–51.0)
Hemoglobin: 13.9 g/dL (ref 13.0–17.7)
Immature Grans (Abs): 0 10*3/uL (ref 0.0–0.1)
Immature Granulocytes: 1 %
Lymphocytes Absolute: 0.5 10*3/uL — ABNORMAL LOW (ref 0.7–3.1)
Lymphs: 25 %
MCH: 30.3 pg (ref 26.6–33.0)
MCHC: 34.9 g/dL (ref 31.5–35.7)
MCV: 87 fL (ref 79–97)
Monocytes Absolute: 0.2 10*3/uL (ref 0.1–0.9)
Monocytes: 7 %
Neutrophils Absolute: 1.2 10*3/uL — ABNORMAL LOW (ref 1.4–7.0)
Neutrophils: 55 %
Platelets: 166 10*3/uL (ref 150–450)
RBC: 4.58 x10E6/uL (ref 4.14–5.80)
RDW: 12.8 % (ref 11.6–15.4)
WBC: 2.1 10*3/uL — CL (ref 3.4–10.8)

## 2022-05-09 LAB — SEDIMENTATION RATE: Sed Rate: 5 mm/hr (ref 0–30)

## 2022-05-09 LAB — ANA W/REFLEX IF POSITIVE: Anti Nuclear Antibody (ANA): NEGATIVE

## 2022-05-10 ENCOUNTER — Other Ambulatory Visit: Payer: Self-pay | Admitting: Physician Assistant

## 2022-05-10 ENCOUNTER — Encounter: Payer: Self-pay | Admitting: Physician Assistant

## 2022-05-10 DIAGNOSIS — D72819 Decreased white blood cell count, unspecified: Secondary | ICD-10-CM

## 2022-05-10 DIAGNOSIS — D72810 Lymphocytopenia: Secondary | ICD-10-CM

## 2022-05-10 LAB — SPOTTED FEVER GROUP ANTIBODIES
Spotted Fever Group IgG: <1:64 {titer}
Spotted Fever Group IgM: <1:64 {titer}

## 2022-05-10 NOTE — Progress Notes (Signed)
   Subjective: Fatigue    Patient ID: Paul Jacobs, male    DOB: Mar 12, 1970, 52 y.o.   MRN: [id number]  HPI Patient complains of continued fatigue with intermittent fever status post tick bite on 3/9//2024.  Patient lab test was negative for Lyme disease and [facility] spotted fever.  Prior to lab test patient was put on prophylactic doxycycline for 10 days.  Sed rate was normal.  ANA was negative.  Patient's WBC decreased from 8.02 months ago to 2.1.  Review of Systems Diabetes, hyperlipidemia, and hypothyroidism.    Objective:   Physical Exam Deferred secondary to this being a telephonic encounter.       Assessment & Plan: Leukopenia   Further evaluation by hematology is warranted.

## 2022-05-10 NOTE — Progress Notes (Signed)
Pt presents today for lab results, Pt complains of still having body aches. Taking ibu every 6 hrs. Burna Sis

## 2022-05-10 NOTE — Addendum Note (Signed)
Addended by: Aliene Altes on: 05/10/2022 01:27 PM   Modules accepted: Orders

## 2022-05-15 ENCOUNTER — Encounter: Payer: Self-pay | Admitting: Oncology

## 2022-05-15 ENCOUNTER — Inpatient Hospital Stay: Payer: 59 | Attending: Oncology | Admitting: Oncology

## 2022-05-15 ENCOUNTER — Inpatient Hospital Stay: Payer: 59

## 2022-05-15 VITALS — BP 139/97 | HR 55 | Temp 97.8°F | Resp 18 | Wt 176.4 lb

## 2022-05-15 DIAGNOSIS — Z87442 Personal history of urinary calculi: Secondary | ICD-10-CM | POA: Insufficient documentation

## 2022-05-15 DIAGNOSIS — D708 Other neutropenia: Secondary | ICD-10-CM

## 2022-05-15 DIAGNOSIS — E119 Type 2 diabetes mellitus without complications: Secondary | ICD-10-CM | POA: Diagnosis not present

## 2022-05-15 DIAGNOSIS — Z87891 Personal history of nicotine dependence: Secondary | ICD-10-CM | POA: Diagnosis not present

## 2022-05-15 DIAGNOSIS — E039 Hypothyroidism, unspecified: Secondary | ICD-10-CM | POA: Diagnosis not present

## 2022-05-15 DIAGNOSIS — D709 Neutropenia, unspecified: Secondary | ICD-10-CM | POA: Insufficient documentation

## 2022-05-15 DIAGNOSIS — Z7984 Long term (current) use of oral hypoglycemic drugs: Secondary | ICD-10-CM | POA: Diagnosis not present

## 2022-05-15 LAB — COMPREHENSIVE METABOLIC PANEL
ALT: 30 U/L (ref 0–44)
AST: 25 U/L (ref 15–41)
Albumin: 3.9 g/dL (ref 3.5–5.0)
Alkaline Phosphatase: 64 U/L (ref 38–126)
Anion gap: 6 (ref 5–15)
BUN: 14 mg/dL (ref 6–20)
CO2: 28 mmol/L (ref 22–32)
Calcium: 8.9 mg/dL (ref 8.9–10.3)
Chloride: 106 mmol/L (ref 98–111)
Creatinine, Ser: 0.74 mg/dL (ref 0.61–1.24)
GFR, Estimated: >60 mL/min (ref >60)
Glucose, Bld: 184 mg/dL — ABNORMAL HIGH (ref 70–99)
Potassium: 4 mmol/L (ref 3.5–5.1)
Sodium: 140 mmol/L (ref 135–145)
Total Bilirubin: 0.5 mg/dL (ref 0.3–1.2)
Total Protein: 7.1 g/dL (ref 6.5–8.1)

## 2022-05-15 LAB — CBC WITH DIFFERENTIAL/PLATELET
Abs Immature Granulocytes: 0.01 10*3/uL (ref 0.00–0.07)
Basophils Absolute: 0.1 10*3/uL (ref 0.0–0.1)
Basophils Relative: 1 %
Eosinophils Absolute: 0.1 10*3/uL (ref 0.0–0.5)
Eosinophils Relative: 2 %
HCT: 41.8 % (ref 39.0–52.0)
Hemoglobin: 14.4 g/dL (ref 13.0–17.0)
Immature Granulocytes: 0 %
Lymphocytes Relative: 39 %
Lymphs Abs: 1.7 10*3/uL (ref 0.7–4.0)
MCH: 29.6 pg (ref 26.0–34.0)
MCHC: 34.4 g/dL (ref 30.0–36.0)
MCV: 85.8 fL (ref 80.0–100.0)
Monocytes Absolute: 0.5 10*3/uL (ref 0.1–1.0)
Monocytes Relative: 11 %
Neutro Abs: 2 10*3/uL (ref 1.7–7.7)
Neutrophils Relative %: 47 %
Platelets: 338 10*3/uL (ref 150–400)
RBC: 4.87 MIL/uL (ref 4.22–5.81)
RDW: 12.9 % (ref 11.5–15.5)
WBC: 4.2 10*3/uL (ref 4.0–10.5)
nRBC: 0 % (ref 0.0–0.2)

## 2022-05-15 LAB — FOLATE: Folate: 26 ng/mL (ref >5.9)

## 2022-05-15 LAB — VITAMIN B12: Vitamin B-12: 1216 pg/mL — ABNORMAL HIGH (ref 180–914)

## 2022-05-15 LAB — HIV ANTIBODY (ROUTINE TESTING W REFLEX): HIV Screen 4th Generation wRfx: NONREACTIVE

## 2022-05-15 NOTE — Progress Notes (Signed)
Hematology/Oncology Progress note [phone] [phone]      REFERRING PROVIDER: Sable Feil, PA-C   CHIEF COMPLAINTS/REASON FOR VISIT:  Evaluation of leukopenia   ASSESSMENT & PLAN:   Other neutropenia ([place]) Previous labs were reviewed and discussed with patient. Neutropenia is acute onset, timeframe fits his recent tick borne disease.  Likely reactive.  Will check CBC, flow cytometry, B12, folate, hepatitis panel, HIV, CMP.  If workup is negative and neutropenia resolves, he does not need to further follow-up routinely with hematology.  Patient agrees with the plan.   Orders Placed This Encounter  Procedures   CBC with Differential/Platelet    Standing Status:   Future    Number of Occurrences:   1    Standing Expiration Date:   05/15/2023   Flow cytometry panel-leukemia/lymphoma work-up    Standing Status:   Future    Number of Occurrences:   1    Standing Expiration Date:   05/15/2023   Vitamin B12    Standing Status:   Future    Number of Occurrences:   1    Standing Expiration Date:   05/15/2023   Folate    Standing Status:   Future    Number of Occurrences:   1    Standing Expiration Date:   05/15/2023   Hepatitis panel, acute    Standing Status:   Future    Number of Occurrences:   1    Standing Expiration Date:   05/15/2023   HIV Antibody (routine testing w rflx)    Standing Status:   Future    Number of Occurrences:   1    Standing Expiration Date:   05/15/2023   Comprehensive metabolic panel    Standing Status:   Future    Number of Occurrences:   1    Standing Expiration Date:   05/15/2023   Follow-up TBD. All questions were answered. The patient knows to call the clinic with any problems, questions or concerns.  Earlie Server, MD, PhD [facility] Hematology Oncology 05/15/2022    HISTORY OF PRESENTING ILLNESS:  Paul Jacobs is a 52 y.o. male who was seen in consultation at the request of Hewitt Blade for evaluation of  leukopenia Reviewed patient's recent labs. 05/08/22 Patient has low total WBC count was 2.1, predominantly neutropenia.  Previous lab records reviewed. Leukopenia duration is acute onset, . Recently patient has had tick bite which occurred on 04/21/2022.  Patient developed papular lesion.  Patient was started on prophylactic regimen of doxycycline.  05/05/2022, patient developed fever, headache and joint pain.  He was advised to continue doxycycline.  He was seen by primary care provider again and had blood work done.  CBC on 05/08/2022 showed neutropenia.   negative for Lyme's disease and [facility] The spotted fever.  Patient reports feeling well.  Denies history hepatitis or HIV infection Denies history of chronic liver disease Denies routine alcohol consumption.  Today patient reports feeling well.  Denies unintentional weight loss, night sweats, fever.  Appetite is good.  MEDICAL HISTORY:  Past Medical History:  Diagnosis Date   Diabetes mellitus, type 2 ([place])    History of chicken pox    History of kidney stones    Hypothyroidism    Low HDL (under 40)    Tinea corporis     SURGICAL HISTORY: Past Surgical History:  Procedure Laterality Date   COLONOSCOPY WITH PROPOFOL N/A 04/24/2021   Procedure: COLONOSCOPY WITH PROPOFOL;  Surgeon: Virgina Jock,  Richardo Hanks, DO;  Location: [facility] ENDOSCOPY;  Service: Gastroenterology;  Laterality: N/A;  IDDM   INGUINAL HERNIA REPAIR Bilateral 2005    SOCIAL HISTORY: Social History   Socioeconomic History   Marital status: Married    Spouse name: Not on file   Number of children: Not on file   Years of education: Not on file   Highest education level: Not on file  Occupational History   Not on file  Tobacco Use   Smoking status: Never   Smokeless tobacco: Former    Types: Chew   Tobacco comments:    Tried a few cigarettes in high school   Vaping Use   Vaping Use: Never used  Substance and Sexual Activity   Alcohol use: Yes    Comment: very  seldom   Drug use: Never   Sexual activity: Not on file  Other Topics Concern   Not on file  Social History Narrative   Not on file   Social Determinants of Health   Financial Resource Strain: Not on file  Food Insecurity: No Food Insecurity (05/15/2022)   Hunger Vital Sign    Worried About Running Out of Food in the Last Year: Never true    Ran Out of Food in the Last Year: Never true  Transportation Needs: No Transportation Needs (05/15/2022)   PRAPARE - Hydrologist (Medical): No    Lack of Transportation (Non-Medical): No  Physical Activity: Not on file  Stress: Not on file  Social Connections: Not on file  Intimate Partner Violence: Not At Risk (05/15/2022)   Humiliation, Afraid, Rape, and Kick questionnaire    Fear of Current or Ex-Partner: No    Emotionally Abused: No    Physically Abused: No    Sexually Abused: No    FAMILY HISTORY: Family History  Problem Relation Age of Onset   Diabetes Father    Diabetes Maternal Grandmother    Leukemia Maternal Great-grandmother     ALLERGIES:  has no allergies on file.  MEDICATIONS:  Current Outpatient Medications  Medication Sig Dispense Refill   aspirin 325 MG tablet aspirin 325 mg tablet     ciclopirox (PENLAC) 8 % solution Apply topically at bedtime. Apply over nail and surrounding skin.  After seven (7) days, may remove with alcohol and continue cycle. 6.6 mL 6   clobetasol cream (TEMOVATE) [id number] % Apply 1 Application topically 2 (two) times daily. 30 g 0   Continuous Blood Gluc Receiver (FREESTYLE LIBRE 2 READER) DEVI Use As Directed 1 each 0   Continuous Blood Gluc Sensor (FREESTYLE LIBRE 2 SENSOR) MISC 1 Device by Does not apply route every 14 (fourteen) days. 2 each 6   Continuous Blood Gluc Sensor (FREESTYLE LIBRE 2 SENSOR) MISC Apply 1 Device topically every 14 (fourteen) days. 2 each 6   fexofenadine-pseudoephedrine (ALLEGRA-D) 60-120 MG 12 hr tablet Take 1 tablet by mouth 2 (two) times  daily. 20 tablet 0   fluorouracil (EFUDEX) 5 % cream      gabapentin (NEURONTIN) 100 MG capsule Take by mouth.     glucose blood (CONTOUR NEXT TEST) test strip TEST 4 TIMES DAILY     glucose blood (FREESTYLE LITE) test strip Use one strip to test qid 200 each 12   GVOKE HYPOPEN 2-PACK 1 MG/0.2ML SOAJ Inject into the skin.     ibuprofen (ADVIL) 800 MG tablet Take 1 tablet (800 mg total) by mouth every 8 (eight) hours as needed for moderate pain.  15 tablet 0   insulin aspart (NOVOLOG FLEXPEN) 100 UNIT/ML FlexPen INJECT 5 TO 7 UNITS UNDER THE SKIN THREE TIMES A DAY BEFORE MEALS** USE 1:15 SLIDING SCALE IF >180 15 mL 5   insulin glargine (LANTUS SOLOSTAR) 100 UNIT/ML Solostar Pen Inject 15 Units into the skin daily. (Patient taking differently: Inject 18 Units into the skin daily.) 15 mL PRN   Insulin Pen Needle (BD PEN NEEDLE NANO 2ND GEN) 32G X 4 MM MISC Use With Insulin Pen Four Times A Day 100 each 2   levothyroxine (SYNTHROID) 125 MCG tablet Take 1 tablet (125 mcg total) by mouth daily. 90 tablet 3   lidocaine (XYLOCAINE) 2 % solution Use as directed 15 mLs in the mouth or throat as needed for mouth pain. Mix with one teaspoon of honey for slow swallow 100 mL 0   metFORMIN (GLUCOPHAGE) 1000 MG tablet Take 1 tablet (1,000 mg total) by mouth 2 (two) times daily. 180 tablet 3   Omega-3 Fatty Acids (FISH OIL PO) Take 1 capsule by mouth daily.     tadalafil (CIALIS) 5 MG tablet      benzonatate (TESSALON) 200 MG capsule Take 1 capsule (200 mg total) by mouth 3 (three) times daily as needed for cough. (Patient not taking: Reported on 05/15/2022) 15 capsule 0   doxycycline (MONODOX) 100 MG capsule Take 1 capsule (100 mg total) by mouth 2 (two) times daily. (Patient not taking: Reported on 05/15/2022) 20 capsule 0   meloxicam (MOBIC) 15 MG tablet Take 1 tablet by mouth daily as needed. (Patient not taking: Reported on 05/15/2022)  0   meloxicam (MOBIC) 7.5 MG tablet Take 7.5 mg by mouth 2 (two) times daily.  (Patient not taking: Reported on 09/06/2021)     methocarbamol (ROBAXIN) 500 MG tablet  (Patient not taking: Reported on 05/15/2022)     Pneumococcal Vac Polyvalent (PNEUMOVAX 23 IJ) Pneumovax-23 25 mcg/0.5 mL injection syringe (Patient not taking: Reported on 09/06/2021)     polyethylene glycol-electrolytes (NULYTELY) 420 g solution TAKE AS DIRECTED FOR COLON PREP. (Patient not taking: Reported on 09/06/2021)     No current facility-administered medications for this visit.     Review of Systems  Constitutional:  Negative for appetite change, chills, fatigue, fever and unexpected weight change.  HENT:   Negative for hearing loss and voice change.   Eyes:  Negative for eye problems and icterus.  Respiratory:  Negative for chest tightness, cough and shortness of breath.   Cardiovascular:  Negative for chest pain and leg swelling.  Gastrointestinal:  Negative for abdominal distention and abdominal pain.  Endocrine: Negative for hot flashes.  Genitourinary:  Negative for difficulty urinating, dysuria and frequency.   Musculoskeletal:  Negative for arthralgias.  Skin:  Negative for itching and rash.  Neurological:  Positive for headaches. Negative for light-headedness and numbness.  Hematological:  Negative for adenopathy. Does not bruise/bleed easily.  Psychiatric/Behavioral:  Negative for confusion.     PHYSICAL EXAMINATION: ECOG PERFORMANCE STATUS: 0 - Asymptomatic Vitals:   05/15/22 1122  BP: (!) 139/97  Pulse: (!) 55  Resp: 18  Temp: 97.8 F (36.6 C)   Filed Weights   05/15/22 1122  Weight: 176 lb 6.4 oz (80 kg)     Physical Exam Constitutional:      General: He is not in acute distress. HENT:     Head: Normocephalic and atraumatic.  Eyes:     General: No scleral icterus. Cardiovascular:     Rate and Rhythm: Normal  rate and regular rhythm.     Heart sounds: Normal heart sounds.  Pulmonary:     Effort: Pulmonary effort is normal. No respiratory distress.     Breath  sounds: No wheezing.  Abdominal:     General: Bowel sounds are normal. There is no distension.     Palpations: Abdomen is soft.  Musculoskeletal:        General: No deformity. Normal range of motion.     Cervical back: Normal range of motion and neck supple.  Skin:    General: Skin is warm and dry.     Findings: No erythema or rash.  Neurological:     Mental Status: He is alert and oriented to person, place, and time. Mental status is at baseline.     Cranial Nerves: No cranial nerve deficit.     Coordination: Coordination normal.  Psychiatric:        Mood and Affect: Mood normal.     RADIOGRAPHIC STUDIES: I have personally reviewed the radiological images as listed and agreed with the findings in the report. No results found.     LABORATORY DATA:  I have reviewed the data as listed    Latest Ref Rng & Units 05/15/2022   12:09 PM 05/08/2022   10:38 AM 03/12/2022    8:32 AM  CBC  WBC 4.0 - 10.5 K/uL 4.2  2.1  8.0   Hemoglobin 13.0 - 17.0 g/dL 14.4  13.9  14.3   Hematocrit 39.0 - 52.0 % 41.8  39.8  41.8   Platelets 150 - 400 K/uL 338  166  305       Latest Ref Rng & Units 05/15/2022   12:09 PM 03/12/2022    8:32 AM 08/30/2021    8:54 AM  CMP  Glucose 70 - 99 mg/dL 184  225  63   BUN 6 - 20 mg/dL 14  18  17    Creatinine 0.61 - 1.24 mg/dL 0.74  0.82  0.77   Sodium 135 - 145 mmol/L 140  138  139   Potassium 3.5 - 5.1 mmol/L 4.0  4.7  4.3   Chloride 98 - 111 mmol/L 106  101  103   CO2 22 - 32 mmol/L 28     Calcium 8.9 - 10.3 mg/dL 8.9  9.2  9.4   Total Protein 6.5 - 8.1 g/dL 7.1  6.3  6.1   Total Bilirubin 0.3 - 1.2 mg/dL 0.5  0.5  0.5   Alkaline Phos 38 - 126 U/L 64  63  57   AST 15 - 41 U/L 25  18  15    ALT 0 - 44 U/L 30  18  19         RADIOGRAPHIC STUDIES: I have personally reviewed the radiological images as listed and agreed with the findings in the report. No results found.

## 2022-05-15 NOTE — Assessment & Plan Note (Signed)
Previous labs were reviewed and discussed with patient. Neutropenia is acute onset, timeframe fits his recent tick borne disease.  Likely reactive.  Will check CBC, flow cytometry, B12, folate, hepatitis panel, HIV, CMP.  If workup is negative and neutropenia resolves, he does not need to further follow-up routinely with hematology.  Patient agrees with the plan.

## 2022-05-16 DIAGNOSIS — M5412 Radiculopathy, cervical region: Secondary | ICD-10-CM | POA: Diagnosis not present

## 2022-05-16 DIAGNOSIS — M9903 Segmental and somatic dysfunction of lumbar region: Secondary | ICD-10-CM | POA: Diagnosis not present

## 2022-05-16 DIAGNOSIS — M9901 Segmental and somatic dysfunction of cervical region: Secondary | ICD-10-CM | POA: Diagnosis not present

## 2022-05-16 DIAGNOSIS — M9902 Segmental and somatic dysfunction of thoracic region: Secondary | ICD-10-CM | POA: Diagnosis not present

## 2022-05-16 LAB — HEPATITIS PANEL, ACUTE
HCV Ab: NONREACTIVE
Hep A IgM: NONREACTIVE
Hep B C IgM: NONREACTIVE
Hepatitis B Surface Ag: NONREACTIVE

## 2022-05-17 LAB — COMP PANEL: LEUKEMIA/LYMPHOMA

## 2022-05-21 ENCOUNTER — Encounter: Payer: Self-pay | Admitting: Physician Assistant

## 2022-05-21 ENCOUNTER — Other Ambulatory Visit: Payer: Self-pay

## 2022-05-21 DIAGNOSIS — M9902 Segmental and somatic dysfunction of thoracic region: Secondary | ICD-10-CM | POA: Diagnosis not present

## 2022-05-21 DIAGNOSIS — E119 Type 2 diabetes mellitus without complications: Secondary | ICD-10-CM

## 2022-05-21 DIAGNOSIS — M9901 Segmental and somatic dysfunction of cervical region: Secondary | ICD-10-CM | POA: Diagnosis not present

## 2022-05-21 DIAGNOSIS — M5412 Radiculopathy, cervical region: Secondary | ICD-10-CM | POA: Diagnosis not present

## 2022-05-21 DIAGNOSIS — M9903 Segmental and somatic dysfunction of lumbar region: Secondary | ICD-10-CM | POA: Diagnosis not present

## 2022-05-22 MED ORDER — METFORMIN HCL 1000 MG PO TABS: 1000.0000 mg | ORAL_TABLET | Freq: Two times a day (BID) | ORAL | 3 refills | Status: DC

## 2022-05-24 ENCOUNTER — Encounter: Payer: Self-pay | Admitting: Physician Assistant

## 2022-05-25 ENCOUNTER — Other Ambulatory Visit: Payer: Self-pay | Admitting: Physician Assistant

## 2022-05-25 NOTE — Progress Notes (Unsigned)
Paul Jacobs sent a Rx refill request to Paul Jacobs for Cialis.  Paul Parcel, PA-C contacted the clinic & said he couldn't find it on Med List & asked to have Paul Jacobs schedule an appointment.  Paper chart in the clinic - Dr. Leilani Merl prescribed 04/30/2018 because Sildenafil was causing headaches. Cialis 5 mg 1 po prior to planned activity, #20 w/3 refills  In the inactive med list, I found a Rx refill from 06/26/2019 refilled by Paul Parcel, PA-C Rx was D/C'd on 12/05/2020 at Paul Jacobs's request - requesting to go back on Sildenafil  Patient messages in Epic medical record dated 11/24/2020 & 12/05/2020 about changing medication. New Rx dated 12/05/2020 for Sildenafil 50 mg 1-5 tabs x1 prn sexual activity, #50 w/2 refills by Nona Dell, PA-C  AMD

## 2022-05-29 ENCOUNTER — Encounter: Payer: Self-pay | Admitting: Physician Assistant

## 2022-05-29 ENCOUNTER — Ambulatory Visit: Payer: Self-pay | Admitting: Physician Assistant

## 2022-05-29 DIAGNOSIS — Z760 Encounter for issue of repeat prescription: Secondary | ICD-10-CM

## 2022-05-29 MED ORDER — TERBINAFINE HCL 1 % EX CREA: 1.0000 | TOPICAL_CREAM | Freq: Two times a day (BID) | CUTANEOUS | 2 refills | Status: DC

## 2022-05-29 MED ORDER — SILDENAFIL CITRATE 20 MG PO TABS: 20.0000 mg | ORAL_TABLET | Freq: Three times a day (TID) | ORAL | 2 refills | Status: AC

## 2022-05-29 NOTE — Progress Notes (Signed)
   Subjective: Medication refill    Patient ID: Paul Jacobs, male    DOB: 03-10-1970, 52 y.o.   MRN: [id number]  HPI Patient presents for refill medication for erectile dysfunction and fungal infection.   Review of Systems Diabetes and hypothyroidism    Objective:   Physical Exam  Physical exam deferred.      Assessment & Plan: Medication refill   Patient medication for Cialis and Lamisil were refilled.

## 2022-06-04 DIAGNOSIS — M9902 Segmental and somatic dysfunction of thoracic region: Secondary | ICD-10-CM | POA: Diagnosis not present

## 2022-06-04 DIAGNOSIS — M9903 Segmental and somatic dysfunction of lumbar region: Secondary | ICD-10-CM | POA: Diagnosis not present

## 2022-06-04 DIAGNOSIS — M9901 Segmental and somatic dysfunction of cervical region: Secondary | ICD-10-CM | POA: Diagnosis not present

## 2022-06-04 DIAGNOSIS — M5412 Radiculopathy, cervical region: Secondary | ICD-10-CM | POA: Diagnosis not present

## 2022-06-18 DIAGNOSIS — M9903 Segmental and somatic dysfunction of lumbar region: Secondary | ICD-10-CM | POA: Diagnosis not present

## 2022-06-18 DIAGNOSIS — M9902 Segmental and somatic dysfunction of thoracic region: Secondary | ICD-10-CM | POA: Diagnosis not present

## 2022-06-18 DIAGNOSIS — M9901 Segmental and somatic dysfunction of cervical region: Secondary | ICD-10-CM | POA: Diagnosis not present

## 2022-06-18 DIAGNOSIS — M5412 Radiculopathy, cervical region: Secondary | ICD-10-CM | POA: Diagnosis not present

## 2022-06-24 ENCOUNTER — Encounter: Payer: Self-pay | Admitting: Physician Assistant

## 2022-06-24 ENCOUNTER — Other Ambulatory Visit: Payer: Self-pay | Admitting: Physician Assistant

## 2022-06-24 DIAGNOSIS — E109 Type 1 diabetes mellitus without complications: Secondary | ICD-10-CM

## 2022-06-25 DIAGNOSIS — M9903 Segmental and somatic dysfunction of lumbar region: Secondary | ICD-10-CM | POA: Diagnosis not present

## 2022-06-25 DIAGNOSIS — M5412 Radiculopathy, cervical region: Secondary | ICD-10-CM | POA: Diagnosis not present

## 2022-06-25 DIAGNOSIS — M9902 Segmental and somatic dysfunction of thoracic region: Secondary | ICD-10-CM | POA: Diagnosis not present

## 2022-06-25 DIAGNOSIS — M9901 Segmental and somatic dysfunction of cervical region: Secondary | ICD-10-CM | POA: Diagnosis not present

## 2022-06-27 DIAGNOSIS — L600 Ingrowing nail: Secondary | ICD-10-CM | POA: Diagnosis not present

## 2022-06-27 DIAGNOSIS — B351 Tinea unguium: Secondary | ICD-10-CM | POA: Diagnosis not present

## 2022-06-27 DIAGNOSIS — E109 Type 1 diabetes mellitus without complications: Secondary | ICD-10-CM | POA: Diagnosis not present

## 2022-07-02 DIAGNOSIS — M9901 Segmental and somatic dysfunction of cervical region: Secondary | ICD-10-CM | POA: Diagnosis not present

## 2022-07-02 DIAGNOSIS — M9903 Segmental and somatic dysfunction of lumbar region: Secondary | ICD-10-CM | POA: Diagnosis not present

## 2022-07-02 DIAGNOSIS — M9902 Segmental and somatic dysfunction of thoracic region: Secondary | ICD-10-CM | POA: Diagnosis not present

## 2022-07-02 DIAGNOSIS — M5412 Radiculopathy, cervical region: Secondary | ICD-10-CM | POA: Diagnosis not present

## 2022-07-03 ENCOUNTER — Other Ambulatory Visit: Payer: Self-pay

## 2022-07-03 NOTE — Telephone Encounter (Signed)
Duplicate request from [facility].  [facility] sent an electronic Rx refill request for Novolog Flexpen 100 unit/ml.  Routed the refill request already in Epic to [employer], PA-C.  AMD

## 2022-07-04 ENCOUNTER — Other Ambulatory Visit: Payer: Self-pay | Admitting: Physician Assistant

## 2022-07-04 DIAGNOSIS — E109 Type 1 diabetes mellitus without complications: Secondary | ICD-10-CM

## 2022-07-04 MED ORDER — NOVOLOG FLEXPEN 100 UNIT/ML ~~LOC~~ SOPN: PEN_INJECTOR | SUBCUTANEOUS | 5 refills | Status: DC

## 2022-07-09 DIAGNOSIS — G8929 Other chronic pain: Secondary | ICD-10-CM | POA: Diagnosis not present

## 2022-07-09 DIAGNOSIS — M545 Low back pain, unspecified: Secondary | ICD-10-CM | POA: Diagnosis not present

## 2022-07-11 DIAGNOSIS — M9903 Segmental and somatic dysfunction of lumbar region: Secondary | ICD-10-CM | POA: Diagnosis not present

## 2022-07-11 DIAGNOSIS — M9902 Segmental and somatic dysfunction of thoracic region: Secondary | ICD-10-CM | POA: Diagnosis not present

## 2022-07-11 DIAGNOSIS — M9901 Segmental and somatic dysfunction of cervical region: Secondary | ICD-10-CM | POA: Diagnosis not present

## 2022-07-11 DIAGNOSIS — M5412 Radiculopathy, cervical region: Secondary | ICD-10-CM | POA: Diagnosis not present

## 2022-07-18 DIAGNOSIS — M9902 Segmental and somatic dysfunction of thoracic region: Secondary | ICD-10-CM | POA: Diagnosis not present

## 2022-07-18 DIAGNOSIS — M9901 Segmental and somatic dysfunction of cervical region: Secondary | ICD-10-CM | POA: Diagnosis not present

## 2022-07-18 DIAGNOSIS — M5412 Radiculopathy, cervical region: Secondary | ICD-10-CM | POA: Diagnosis not present

## 2022-07-18 DIAGNOSIS — M9903 Segmental and somatic dysfunction of lumbar region: Secondary | ICD-10-CM | POA: Diagnosis not present

## 2022-07-25 ENCOUNTER — Other Ambulatory Visit: Payer: Self-pay | Admitting: Physician Assistant

## 2022-07-25 ENCOUNTER — Encounter: Payer: Self-pay | Admitting: Physician Assistant

## 2022-07-25 DIAGNOSIS — M9903 Segmental and somatic dysfunction of lumbar region: Secondary | ICD-10-CM | POA: Diagnosis not present

## 2022-07-25 DIAGNOSIS — M5412 Radiculopathy, cervical region: Secondary | ICD-10-CM | POA: Diagnosis not present

## 2022-07-25 DIAGNOSIS — M9902 Segmental and somatic dysfunction of thoracic region: Secondary | ICD-10-CM | POA: Diagnosis not present

## 2022-07-25 DIAGNOSIS — M9901 Segmental and somatic dysfunction of cervical region: Secondary | ICD-10-CM | POA: Diagnosis not present

## 2022-07-25 MED ORDER — LEVOTHYROXINE SODIUM 125 MCG PO TABS: 125.0000 ug | ORAL_TABLET | Freq: Every day | ORAL | 3 refills | Status: DC

## 2022-07-30 DIAGNOSIS — M9901 Segmental and somatic dysfunction of cervical region: Secondary | ICD-10-CM | POA: Diagnosis not present

## 2022-07-30 DIAGNOSIS — M9902 Segmental and somatic dysfunction of thoracic region: Secondary | ICD-10-CM | POA: Diagnosis not present

## 2022-07-30 DIAGNOSIS — M9903 Segmental and somatic dysfunction of lumbar region: Secondary | ICD-10-CM | POA: Diagnosis not present

## 2022-07-30 DIAGNOSIS — M5412 Radiculopathy, cervical region: Secondary | ICD-10-CM | POA: Diagnosis not present

## 2022-08-06 DIAGNOSIS — M9902 Segmental and somatic dysfunction of thoracic region: Secondary | ICD-10-CM | POA: Diagnosis not present

## 2022-08-06 DIAGNOSIS — M5412 Radiculopathy, cervical region: Secondary | ICD-10-CM | POA: Diagnosis not present

## 2022-08-06 DIAGNOSIS — M9901 Segmental and somatic dysfunction of cervical region: Secondary | ICD-10-CM | POA: Diagnosis not present

## 2022-08-06 DIAGNOSIS — M9903 Segmental and somatic dysfunction of lumbar region: Secondary | ICD-10-CM | POA: Diagnosis not present

## 2022-08-13 DIAGNOSIS — M9903 Segmental and somatic dysfunction of lumbar region: Secondary | ICD-10-CM | POA: Diagnosis not present

## 2022-08-13 DIAGNOSIS — M5412 Radiculopathy, cervical region: Secondary | ICD-10-CM | POA: Diagnosis not present

## 2022-08-13 DIAGNOSIS — M9901 Segmental and somatic dysfunction of cervical region: Secondary | ICD-10-CM | POA: Diagnosis not present

## 2022-08-13 DIAGNOSIS — M9902 Segmental and somatic dysfunction of thoracic region: Secondary | ICD-10-CM | POA: Diagnosis not present

## 2022-08-20 ENCOUNTER — Other Ambulatory Visit: Payer: Self-pay

## 2022-08-20 ENCOUNTER — Other Ambulatory Visit: Payer: Self-pay | Admitting: Physician Assistant

## 2022-08-20 DIAGNOSIS — E109 Type 1 diabetes mellitus without complications: Secondary | ICD-10-CM

## 2022-08-20 MED ORDER — BLOOD GLUCOSE MONITOR KIT: PACK | 0 refills | Status: AC

## 2022-08-29 DIAGNOSIS — M9903 Segmental and somatic dysfunction of lumbar region: Secondary | ICD-10-CM | POA: Diagnosis not present

## 2022-08-29 DIAGNOSIS — M5412 Radiculopathy, cervical region: Secondary | ICD-10-CM | POA: Diagnosis not present

## 2022-08-29 DIAGNOSIS — M9902 Segmental and somatic dysfunction of thoracic region: Secondary | ICD-10-CM | POA: Diagnosis not present

## 2022-08-29 DIAGNOSIS — M9901 Segmental and somatic dysfunction of cervical region: Secondary | ICD-10-CM | POA: Diagnosis not present

## 2022-09-06 ENCOUNTER — Ambulatory Visit: Payer: Self-pay

## 2022-09-06 DIAGNOSIS — Z0000 Encounter for general adult medical examination without abnormal findings: Secondary | ICD-10-CM

## 2022-09-06 LAB — POCT URINALYSIS DIPSTICK
Bilirubin, UA: NEGATIVE
Blood, UA: POSITIVE
Glucose, UA: NEGATIVE
Ketones, UA: NEGATIVE
Leukocytes, UA: NEGATIVE
Nitrite, UA: NEGATIVE
Protein, UA: NEGATIVE
Spec Grav, UA: 1.015 (ref 1.010–1.025)
Urobilinogen, UA: 0.2 E.U./dL
pH, UA: 6 (ref 5.0–8.0)

## 2022-09-06 NOTE — Progress Notes (Signed)
Possible STS in today's annual hearing screen.  Reports constant ringing in right ear - already documented on the Hearing Questionnaire. 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 States he's been using ear muffs more than ear plugs -especially in summer times - using some different outdoor maintenance equipment.  Feels like he gets more protection with the muffs when he's using those particular pieces of equipment for [employer] job at [employer]. States he cleaned his ears last week - no cerumen noted bilaterally upon evaluation with otoscope.  30 day Hearing Retest scheduled for 10/05/2022 at 8:30 am  AMD

## 2022-09-07 LAB — CMP12+LP+TP+TSH+6AC+PSA+CBC…
ALT: 17 IU/L (ref 0–44)
AST: 18 IU/L (ref 0–40)
Albumin: 4.3 g/dL (ref 3.8–4.9)
Alkaline Phosphatase: 58 IU/L (ref 44–121)
BUN/Creatinine Ratio: 22 — ABNORMAL HIGH (ref 9–20)
BUN: 17 mg/dL (ref 6–24)
Basophils Absolute: 0 10*3/uL (ref 0.0–0.2)
Basos: 1 %
Bilirubin Total: 0.6 mg/dL (ref 0.0–1.2)
Calcium: 9.4 mg/dL (ref 8.7–10.2)
Chloride: 104 mmol/L (ref 96–106)
Chol/HDL Ratio: 2.6 ratio (ref 0.0–5.0)
Cholesterol, Total: 179 mg/dL (ref 100–199)
Creatinine, Ser: 0.77 mg/dL (ref 0.76–1.27)
EOS (ABSOLUTE): 0.3 10*3/uL (ref 0.0–0.4)
Eos: 5 %
Estimated CHD Risk: <0.5 times avg. (ref 0.0–1.0)
Free Thyroxine Index: 4.2 (ref 1.2–4.9)
GGT: 10 IU/L (ref 0–65)
Globulin, Total: 1.8 g/dL (ref 1.5–4.5)
Glucose: 74 mg/dL (ref 70–99)
HDL: 69 mg/dL (ref >39)
Hematocrit: 43.1 % (ref 37.5–51.0)
Hemoglobin: 14.6 g/dL (ref 13.0–17.7)
Immature Grans (Abs): 0 10*3/uL (ref 0.0–0.1)
Immature Granulocytes: 0 %
Iron: 154 ug/dL (ref 38–169)
LDH: 136 IU/L (ref 121–224)
LDL Chol Calc (NIH): 98 mg/dL (ref 0–99)
Lymphocytes Absolute: 1.5 10*3/uL (ref 0.7–3.1)
Lymphs: 31 %
MCH: 30.2 pg (ref 26.6–33.0)
MCHC: 33.9 g/dL (ref 31.5–35.7)
MCV: 89 fL (ref 79–97)
Monocytes Absolute: 0.4 10*3/uL (ref 0.1–0.9)
Monocytes: 9 %
Neutrophils Absolute: 2.6 10*3/uL (ref 1.4–7.0)
Neutrophils: 54 %
Phosphorus: 3.4 mg/dL (ref 2.8–4.1)
Platelets: 310 10*3/uL (ref 150–450)
Potassium: 4.2 mmol/L (ref 3.5–5.2)
Prostate Specific Ag, Serum: 0.8 ng/mL (ref 0.0–4.0)
RBC: 4.84 x10E6/uL (ref 4.14–5.80)
RDW: 13 % (ref 11.6–15.4)
Sodium: 139 mmol/L (ref 134–144)
T3 Uptake Ratio: 33 % (ref 24–39)
T4, Total: 12.6 ug/dL — ABNORMAL HIGH (ref 4.5–12.0)
TSH: 0.136 u[IU]/mL — ABNORMAL LOW (ref 0.450–4.500)
Total Protein: 6.1 g/dL (ref 6.0–8.5)
Triglycerides: 62 mg/dL (ref 0–149)
Uric Acid: 5.3 mg/dL (ref 3.8–8.4)
VLDL Cholesterol Cal: 12 mg/dL (ref 5–40)
WBC: 4.9 10*3/uL (ref 3.4–10.8)
eGFR: 107 mL/min/{1.73_m2} (ref >59)

## 2022-09-07 LAB — MICROALBUMIN / CREATININE URINE RATIO
Creatinine, Urine: 180 mg/dL
Microalb/Creat Ratio: 6 mg/g creat (ref 0–29)
Microalbumin, Urine: 11.3 ug/mL

## 2022-09-07 LAB — HGB A1C W/O EAG: Hgb A1c MFr Bld: 6.1 % — ABNORMAL HIGH (ref 4.8–5.6)

## 2022-09-10 DIAGNOSIS — M9902 Segmental and somatic dysfunction of thoracic region: Secondary | ICD-10-CM | POA: Diagnosis not present

## 2022-09-10 DIAGNOSIS — M5412 Radiculopathy, cervical region: Secondary | ICD-10-CM | POA: Diagnosis not present

## 2022-09-10 DIAGNOSIS — M9901 Segmental and somatic dysfunction of cervical region: Secondary | ICD-10-CM | POA: Diagnosis not present

## 2022-09-10 DIAGNOSIS — M9903 Segmental and somatic dysfunction of lumbar region: Secondary | ICD-10-CM | POA: Diagnosis not present

## 2022-09-12 ENCOUNTER — Encounter: Payer: Self-pay | Admitting: Physician Assistant

## 2022-09-12 ENCOUNTER — Other Ambulatory Visit: Payer: Self-pay | Admitting: Physician Assistant

## 2022-09-12 ENCOUNTER — Ambulatory Visit: Payer: Self-pay | Admitting: Physician Assistant

## 2022-09-12 DIAGNOSIS — Z794 Long term (current) use of insulin: Secondary | ICD-10-CM

## 2022-09-12 DIAGNOSIS — R319 Hematuria, unspecified: Secondary | ICD-10-CM

## 2022-09-12 LAB — POCT URINALYSIS DIPSTICK
Bilirubin, UA: NEGATIVE
Blood, UA: NEGATIVE
Glucose, UA: NEGATIVE
Ketones, UA: NEGATIVE
Leukocytes, UA: NEGATIVE
Nitrite, UA: NEGATIVE
Protein, UA: NEGATIVE
Spec Grav, UA: <=1.005 — AB (ref 1.010–1.025)
Urobilinogen, UA: 0.2 E.U./dL
pH, UA: 6 (ref 5.0–8.0)

## 2022-09-12 MED ORDER — BD PEN NEEDLE NANO 2ND GEN 32G X 4 MM MISC
2 refills | Status: DC
Start: 2022-09-12 — End: 2023-12-23

## 2022-09-12 NOTE — Progress Notes (Signed)
Pt presents today to complete physical, Pt requesting refill on insulin pen needles.

## 2022-09-12 NOTE — Progress Notes (Signed)
[facility] ____________________________________________   None    (approximate)  I have reviewed the triage vital signs and the nursing notes.   HISTORY  Chief Complaint No chief complaint on file.  HPI Paul Jacobs is a 52 y.o. male patient presents for annual physical exam.  Voices no concerns or complaints.  Request prescription for insulin pen needles.         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 07/10/2021   DJD of left AC (acromioclavicular) joint 07/10/2021   Rotator cuff tendinitis, left 07/10/2021   Fracture of distal phalanx of finger 01/04/2020   Digital mucous cyst of right hand 04/08/2019   Cervical spine pain 11/03/2018   Strain of left trapezius muscle 11/03/2018   Other hyperlipidemia 09/02/2018   Onychomycosis 09/02/2018   Type 1 diabetes mellitus (HCC) 09/10/2013   Long-term insulin use (HCC) 09/08/2013   Type II diabetes mellitus (HCC) 08/25/2013   Hypothyroidism 08/25/2013    Past Surgical History:  Procedure Laterality Date   COLONOSCOPY WITH PROPOFOL N/A 04/24/2021   Procedure: COLONOSCOPY WITH PROPOFOL;  Surgeon: Jaynie Collins, DO;  Location: [facility] ENDOSCOPY;  Service: Gastroenterology;  Laterality: N/A;  IDDM   INGUINAL HERNIA REPAIR Bilateral 2005    Prior to Admission medications   Medication Sig Start Date End Date Taking? Authorizing Provider  aspirin 325 MG tablet aspirin 325 mg tablet   Yes [provider]  blood glucose meter kit and supplies KIT Dispense based on patient and insurance preference. Use up to four times daily as directed. 08/20/22  Yes Joni Reining, PA-C  Continuous  Blood Gluc Receiver (FREESTYLE LIBRE 2 READER) DEVI Use As Directed 12/26/20  Yes Joni Reining, PA-C  Continuous Blood Gluc Sensor (FREESTYLE LIBRE 2 SENSOR) MISC 1 Device by Does not apply route every 14 (fourteen) days. 12/13/20  Yes Joni Reining, PA-C  Continuous Blood Gluc Sensor (FREESTYLE LIBRE 2 SENSOR) MISC Apply 1 Device topically every 14 (fourteen) days. 07/20/21  Yes Joni Reining, PA-C  glucose blood (CONTOUR NEXT TEST) test strip TEST 4 TIMES DAILY 06/18/16  Yes [provider]  glucose blood (FREESTYLE LITE) test strip Use one strip to test qid 09/11/21  Yes Joni Reining, PA-C  clobetasol cream (TEMOVATE) 0.05 % Apply 1 Application topically 2 (two) times daily. Patient not taking: Reported on 09/12/2022 04/30/22   Joni Reining, PA-C  insulin aspart (NOVOLOG FLEXPEN) 100 UNIT/ML FlexPen INJECT 5 TO 7 UNITS UNDER THE SKIN THREE TIMES A DAY BEFORE A MEAL (USE 1:15 SLIDING SCALE IF >180) 07/04/22   Joni Reining, PA-C  insulin glargine (LANTUS SOLOSTAR) 100 UNIT/ML Solostar Pen Inject 15 Units into the skin daily. Patient taking differently: Inject 18 Units into the skin daily. 02/27/22   Joni Reining, PA-C  Insulin Pen Needle (BD PEN NEEDLE NANO 2ND GEN) 32G X 4 MM MISC Use With Insulin Pen Four Times A Day 03/02/20   Pricilla Handler, MD  levothyroxine (SYNTHROID) 125 MCG tablet Take 1 tablet (125 mcg total) by mouth daily. 07/25/22   Nona Dell  K, PA-C  lidocaine (XYLOCAINE) 2 % solution Use as directed 15 mLs in the mouth or throat as needed for mouth pain. Mix with one teaspoon of honey for slow swallow 03/08/22   Joni Reining, PA-C  meloxicam (MOBIC) 15 MG tablet Take 1 tablet by mouth daily as needed. Patient not taking: Reported on 05/15/2022 10/06/17   [provider]  meloxicam (MOBIC) 7.5 MG tablet Take 7.5 mg by mouth 2 (two) times daily. Patient not taking: Reported on 09/06/2021 09/14/19   [provider]  metFORMIN (GLUCOPHAGE) 1000 MG  tablet Take 1 tablet (1,000 mg total) by mouth 2 (two) times daily. 05/22/22   Joni Reining, PA-C  methocarbamol (ROBAXIN) 500 MG tablet     [provider]  Omega-3 Fatty Acids (FISH OIL PO) Take 1 capsule by mouth daily.    [provider]  Pneumococcal Vac Polyvalent (PNEUMOVAX 23 IJ) Pneumovax-23 25 mcg/0.5 mL injection syringe Patient not taking: Reported on 09/06/2021    [provider]  polyethylene glycol-electrolytes (NULYTELY) 420 g solution TAKE AS DIRECTED FOR COLON PREP. Patient not taking: Reported on 09/06/2021 11/04/20   [provider]  sildenafil (REVATIO) 20 MG tablet Take 1 tablet (20 mg total) by mouth 3 (three) times daily. 1-5 tablets PRN 05/29/22   Joni Reining, PA-C  tadalafil (CIALIS) 5 MG tablet Take by mouth. 04/14/20   [provider]  terbinafine (LAMISIL AT) 1 % cream Apply 1 Application topically 2 (two) times daily. 05/29/22   Joni Reining, PA-C    Allergies Patient has no allergy information on record.  Family History  Problem Relation Age of Onset   Diabetes Father    Diabetes Maternal Grandmother    Leukemia Maternal Great-grandmother     Social History Social History   Tobacco Use   Smoking status: Never   Smokeless tobacco: Former    Types: Chew   Tobacco comments:    Tried a few cigarettes in high school   Vaping Use   Vaping status: Never Used  Substance Use Topics   Alcohol use: Yes    Comment: very seldom   Drug use: Never    Review of Systems  Constitutional: No fever/chills Eyes: No visual changes. ENT: No sore throat. Cardiovascular: Denies chest pain. Respiratory: Denies shortness of breath. Gastrointestinal: No abdominal pain.  No nausea, no vomiting.  No diarrhea.  No constipation. Genitourinary: Negative for dysuria. Musculoskeletal: Negative for back pain. Skin: Negative for rash. Neurological: Negative for headaches, focal weakness or numbness. Endocrine: Diabetes and  hypothyroidism   ____________________________________________   PHYSICAL EXAM:  VITAL SIGNS: BP 126/79  Pulse 60  Resp 12  Temp 97.6 F (36.4 C)  Temp src Temporal  SpO2 99 %  Weight 174 lb (78.9 kg)  Height 6\' 2"  (1.88 m)   BMI 22.34 kg/m2  BSA 2.03 m2  Tobacco  Smoking status Never  Smokeless status Former  Used Chew   Constitutional: Alert and oriented. Well appearing and in no acute distress. Eyes: Conjunctivae are normal. PERRL. EOMI. Head: Atraumatic. Nose: No congestion/rhinnorhea. Mouth/Throat: Mucous membranes are moist.  Oropharynx non-erythematous. Neck: No stridor.  No cervical spine tenderness to palpation. Hematological/Lymphatic/Immunilogical: No cervical lymphadenopathy. Cardiovascular: Normal rate, regular rhythm. Grossly normal heart sounds.  Good peripheral circulation. Respiratory: Normal respiratory effort.  No retractions. Lungs CTAB. Gastrointestinal: Soft and nontender. No distention. No abdominal bruits. No CVA tenderness. Genitourinary: Deferred Musculoskeletal: No lower extremity tenderness nor edema.  No joint effusions. Neurologic:  Normal speech and language. No gross focal neurologic deficits are appreciated. No gait instability.  Diabetic foot tactile test revealed decreased sensation to right foot.  Skin:  Skin is warm, dry and intact. No rash noted.  Hypertrophic toenails Psychiatric: Mood and affect are normal. Speech and behavior are normal.  ____________________________________________   LABS           Component Ref Range & Units 08:53 6 d ago 1 yr ago 2 yr ago 3 yr ago 4 yr ago  Color, UA Light Yellow Dark Yellow Yellow yellow Yellow Light Yellow  Clarity, UA Clear Clear Clear clear Clear Clear  Glucose, UA Negative Negative Negative Negative Negative Positive Abnormal  CM Negative  Bilirubin, UA Negative Negative Negative negative Negative Negative  Ketones, UA Negative Negative Negative negative Negative Negative  Spec  Grav, UA 1.010 - 1.025 <=1.005 Abnormal  1.015 1.020 1.020 >=1.030 Abnormal  1.010  Blood, UA Negative Positive CM Negative positive 2+ Positive CM 2+  pH, UA 5.0 - 8.0 6.0 6.0 6.0 6.0 5.5 6.0  Protein, UA Negative Negative Negative Negative Negative Negative Negative  Urobilinogen, UA 0.2 or 1.0 E.U./dL 0.2 0.2 0.2 0.2 0.2 0.2  Nitrite, UA Negative Negative Negative negative Negative Negative  Leukocytes, UA Negative Negative Negative Negative Negative Negative Negative  Appearance    medium              _           Component Ref Range & Units 6 d ago (09/06/22) 6 mo ago (03/12/22) 1 yr ago (08/30/21) 2 yr ago (08/23/20) 2 yr ago (02/24/20) 3 yr ago (08/26/19) 4 yr ago (08/28/18)  Creatinine, Urine Not Estab. mg/dL 433.2 951.8 84.1 66.0 CANCELED R, CM 113.4 32.2  Microalbumin, Urine Not Estab. ug/mL 11.3 14.0 <3.0 3.9 CANCELED R, CM <3.0 <3.0  Microalb/Creat Ratio 0 - 29 mg/g creat 6 12 CM <3 CM 4 CM  <3 CM <9 CM                       Component Ref Range & Units 6 d ago (09/06/22) 4 mo ago (05/15/22) 4 mo ago (05/15/22) 4 mo ago (05/08/22) 6 mo ago (03/12/22) 1 yr ago (08/30/21) 1 yr ago (03/02/21)  Glucose 70 - 99 mg/dL 74 630 High  CM   160 High  63 Low  114 High   Uric Acid 3.8 - 8.4 mg/dL 5.3    3.7 Low  CM 5.0 CM 4.8 CM  Comment:            Therapeutic target for gout patients: <6.0  BUN 6 - 24 mg/dL 17 14 R   18 17 14   Creatinine, Ser 0.76 - 1.27 mg/dL 1.09 3.23 R   5.57 3.22 0.71 Low   eGFR >59 mL/min/1.73 107    105 108 110  BUN/Creatinine Ratio 9 - 20 22 High     22 High  22 High  20  Sodium 134 - 144 mmol/L 139 140 R   138 139 140  Potassium 3.5 - 5.2 mmol/L 4.2 4.0 R   4.7 4.3 4.3  Chloride 96 - 106 mmol/L 104 106 R   101 103 103  Calcium 8.7 - 10.2 mg/dL 9.4 8.9 R   9.2 9.4 9.5  Phosphorus 2.8 - 4.1 mg/dL 3.4    3.1 4.1 4.3 High   Total Protein 6.0 - 8.5 g/dL 6.1 7.1 R   6.3 6.1 5.9 Low  Albumin 3.8 - 4.9 g/dL 4.3 3.9 R   4.2 4.3 CM 4.2  Globulin,  Total 1.5 - 4.5 g/dL 1.8    2.1 1.8 1.7  Bilirubin Total 0.0 - 1.2 mg/dL 0.6 0.5 R   0.5 0.5 0.4  Alkaline Phosphatase 44 - 121 IU/L 58 64 R   63 57 63  LDH 121 - 224 IU/L 136    188 137 131  AST 0 - 40 IU/L 18 25 R   18 15 17   ALT 0 - 44 IU/L 17 30 R   18 19 20   GGT 0 - 65 IU/L 10    6 10 10   Iron 38 - 169 ug/dL 829    562 130 865  Cholesterol, Total 100 - 199 mg/dL 784    696 295 284  Triglycerides 0 - 149 mg/dL 62    35 60 52  HDL >13 mg/dL 69    77 68 63  VLDL Cholesterol Cal 5 - 40 mg/dL 12    8 11 11   LDL Chol Calc (NIH) 0 - 99 mg/dL 98    80 244 High  94  Chol/HDL Ratio 0.0 - 5.0 ratio 2.6    2.1 CM 2.8 CM 2.7 CM  Comment:                                   T. Chol/HDL Ratio                                             Men  Women                               1/2 Avg.Risk  3.4    3.3                                   Avg.Risk  5.0    4.4                                2X Avg.Risk  9.6    7.1                                3X Avg.Risk 23.4   11.0  Estimated CHD Risk 0.0 - 1.0 times avg.  < 0.5     < 0.5 CM  < 0.5 CM  < 0.5 CM  Comment: The CHD Risk is based on the T. Chol/HDL ratio. Other factors affect CHD Risk such as hypertension, smoking, diabetes, severe obesity, and family history of premature CHD.  TSH 0.450 - 4.500 uIU/mL 0.136 Low     0.249 Low  0.266 Low  0.010 Low   T4, Total 4.5 - 12.0 ug/dL 01.0 High     27.2 53.6 10.1  T3 Uptake Ratio 24 - 39 % 33    28 29 29   Free Thyroxine Index 1.2 - 4.9 4.2    2.9 3.4 2.9  Prostate Specific Ag, Serum 0.0 - 4.0 ng/mL 0.8    0.7 CM 0.7 CM   Comment: Roche ECLIA  methodology. According to the American Urological Association, Serum PSA should decrease and remain at undetectable levels after radical prostatectomy. The AUA defines biochemical recurrence as an initial PSA value 0.2 ng/mL or greater followed by a subsequent confirmatory PSA value 0.2 ng/mL or greater. Values obtained with different assay methods or  kits cannot be used interchangeably. Results cannot be interpreted as absolute evidence of the presence or absence of malignant disease.  WBC 3.4 - 10.8 x10E3/uL 4.9  4.2 R 2.1 Low Panic  8.0 5.1 5.5  RBC 4.14 - 5.80 x10E6/uL 4.84  4.87 R 4.58 4.77 4.77 4.89  Hemoglobin 13.0 - 17.7 g/dL 47.4  25.9 R 56.3 87.5 14.8 15.0  Hematocrit 37.5 - 51.0 % 43.1  41.8 R 39.8 41.8 43.0 41.7  MCV 79 - 97 fL 89  85.8 R 87 88 90 85  MCH 26.6 - 33.0 pg 30.2  29.6 R 30.3 30.0 31.0 30.7  MCHC 31.5 - 35.7 g/dL 64.3  32.9 R 51.8 84.1 34.4 36.0 High   RDW 11.6 - 15.4 % 13.0  12.9 R 12.8 12.6 12.7 12.9  Platelets 150 - 450 x10E3/uL 310  338 R 166 305 285 343  Neutrophils Not Estab. % 54  47 R 55 63 56 58  Lymphs Not Estab. % 31   25 29 31 30   Monocytes Not Estab. % 9   7 6 8 9   Eos Not Estab. % 5   12 1 4 2   Basos Not Estab. % 1   0 1 1 1   Neutrophils Absolute 1.4 - 7.0 x10E3/uL 2.6  2.0 R 1.2 Low  5.1 2.9 3.2  Lymphocytes Absolute 0.7 - 3.1 x10E3/uL 1.5  1.7 R 0.5 Low  2.3 1.6 1.6  Monocytes Absolute 0.1 - 0.9 x10E3/uL 0.4   0.2 0.5 0.4 0.5  EOS (ABSOLUTE) 0.0 - 0.4 x10E3/uL 0.3   0.3 0.1 0.2 0.1  Basophils Absolute 0.0 - 0.2 x10E3/uL 0.0  0.1 R 0.0 0.1 0.0 0.0  Immature Granulocytes Not Estab. % 0  0 R 1 0 0 0  Immature Grans (Abs)           ___________________________________________  EKG  Bradycardia 52 bpm ____________________________________________    ____________________________________________   INITIAL IMPRESSION / ASSESSMENT AND PLAN  As part of my medical decision making, I reviewed the following data within the electronic MEDICAL RECORD NUMBER  No acute findings on physical exam.  Repeat urinalysis negative for hematuria.  Discussed low TSH.  Will repeat in 1 month.  Patient has a consult to podiatry to consider bilateral great toe removal.  Patient will discuss Lamisil therapy for onychomycosis.  Prescription for insulin needles sent to pharmacy.             ____________________________________________   FINAL CLINICAL IMPRESSION  Well exam  ED Discharge Orders          Ordered    POCT urinalysis dipstick        09/12/22 0847             Note:  This document was prepared using Dragon voice recognition software and may include unintentional dictation errors.

## 2022-09-14 ENCOUNTER — Encounter: Payer: Self-pay | Admitting: Physician Assistant

## 2022-09-14 ENCOUNTER — Other Ambulatory Visit: Payer: Self-pay | Admitting: Physician Assistant

## 2022-09-14 DIAGNOSIS — E109 Type 1 diabetes mellitus without complications: Secondary | ICD-10-CM

## 2022-09-24 DIAGNOSIS — M9903 Segmental and somatic dysfunction of lumbar region: Secondary | ICD-10-CM | POA: Diagnosis not present

## 2022-09-24 DIAGNOSIS — M9901 Segmental and somatic dysfunction of cervical region: Secondary | ICD-10-CM | POA: Diagnosis not present

## 2022-09-24 DIAGNOSIS — M5412 Radiculopathy, cervical region: Secondary | ICD-10-CM | POA: Diagnosis not present

## 2022-09-24 DIAGNOSIS — M9902 Segmental and somatic dysfunction of thoracic region: Secondary | ICD-10-CM | POA: Diagnosis not present

## 2022-09-26 DIAGNOSIS — M9902 Segmental and somatic dysfunction of thoracic region: Secondary | ICD-10-CM | POA: Diagnosis not present

## 2022-09-26 DIAGNOSIS — M9901 Segmental and somatic dysfunction of cervical region: Secondary | ICD-10-CM | POA: Diagnosis not present

## 2022-09-26 DIAGNOSIS — M9903 Segmental and somatic dysfunction of lumbar region: Secondary | ICD-10-CM | POA: Diagnosis not present

## 2022-09-26 DIAGNOSIS — M5412 Radiculopathy, cervical region: Secondary | ICD-10-CM | POA: Diagnosis not present

## 2022-10-01 DIAGNOSIS — M5412 Radiculopathy, cervical region: Secondary | ICD-10-CM | POA: Diagnosis not present

## 2022-10-01 DIAGNOSIS — M9902 Segmental and somatic dysfunction of thoracic region: Secondary | ICD-10-CM | POA: Diagnosis not present

## 2022-10-01 DIAGNOSIS — M9903 Segmental and somatic dysfunction of lumbar region: Secondary | ICD-10-CM | POA: Diagnosis not present

## 2022-10-01 DIAGNOSIS — M9901 Segmental and somatic dysfunction of cervical region: Secondary | ICD-10-CM | POA: Diagnosis not present

## 2022-10-05 ENCOUNTER — Other Ambulatory Visit: Payer: Self-pay

## 2022-10-05 ENCOUNTER — Ambulatory Visit: Payer: Self-pay

## 2022-10-05 ENCOUNTER — Telehealth: Payer: Self-pay

## 2022-10-05 DIAGNOSIS — Z01118 Encounter for examination of ears and hearing with other abnormal findings: Secondary | ICD-10-CM

## 2022-10-05 NOTE — Progress Notes (Signed)
Today's hearing retest still showing possible recordable shift in the right ear.  Appointment scheduled 10/09/2022 at 1:00 am with Dr. Lanell Persons, [facility] Medical Director for evaluation .  [place] notified of the appointment.  Requested I send the address of Dr. Theresia Lo to him through Mychart.  AMD

## 2022-10-05 NOTE — Telephone Encounter (Signed)
Appointment scheduled for 10/09/2022 at 1:00 pm with Dr. Lanell Persons:  [facility] at [facility] [address] [place], [place] [address] [phone]  If you have any questions, let me know. If for some reason the above appointment doesn't work with your schedule, you can call the above phone number & reschedule.  Just let me know if you do reschedule.  AMD

## 2022-10-08 DIAGNOSIS — M5412 Radiculopathy, cervical region: Secondary | ICD-10-CM | POA: Diagnosis not present

## 2022-10-08 DIAGNOSIS — M9901 Segmental and somatic dysfunction of cervical region: Secondary | ICD-10-CM | POA: Diagnosis not present

## 2022-10-08 DIAGNOSIS — M9903 Segmental and somatic dysfunction of lumbar region: Secondary | ICD-10-CM | POA: Diagnosis not present

## 2022-10-08 DIAGNOSIS — M9902 Segmental and somatic dysfunction of thoracic region: Secondary | ICD-10-CM | POA: Diagnosis not present

## 2022-10-15 DIAGNOSIS — M9901 Segmental and somatic dysfunction of cervical region: Secondary | ICD-10-CM | POA: Diagnosis not present

## 2022-10-15 DIAGNOSIS — M5412 Radiculopathy, cervical region: Secondary | ICD-10-CM | POA: Diagnosis not present

## 2022-10-15 DIAGNOSIS — M9902 Segmental and somatic dysfunction of thoracic region: Secondary | ICD-10-CM | POA: Diagnosis not present

## 2022-10-15 DIAGNOSIS — M9903 Segmental and somatic dysfunction of lumbar region: Secondary | ICD-10-CM | POA: Diagnosis not present

## 2022-10-16 ENCOUNTER — Encounter: Payer: Self-pay | Admitting: Physician Assistant

## 2022-10-18 ENCOUNTER — Other Ambulatory Visit: Payer: Self-pay | Admitting: Physician Assistant

## 2022-10-18 MED ORDER — PSEUDOEPH-BROMPHEN-DM 30-2-10 MG/5ML PO SYRP: 5.0000 mL | ORAL_SOLUTION | Freq: Four times a day (QID) | ORAL | 0 refills | Status: DC | PRN

## 2022-10-24 DIAGNOSIS — M9902 Segmental and somatic dysfunction of thoracic region: Secondary | ICD-10-CM | POA: Diagnosis not present

## 2022-10-24 DIAGNOSIS — M9901 Segmental and somatic dysfunction of cervical region: Secondary | ICD-10-CM | POA: Diagnosis not present

## 2022-10-24 DIAGNOSIS — M9903 Segmental and somatic dysfunction of lumbar region: Secondary | ICD-10-CM | POA: Diagnosis not present

## 2022-10-24 DIAGNOSIS — M5412 Radiculopathy, cervical region: Secondary | ICD-10-CM | POA: Diagnosis not present

## 2022-11-02 ENCOUNTER — Ambulatory Visit: Payer: Self-pay

## 2022-11-02 DIAGNOSIS — Z01118 Encounter for examination of ears and hearing with other abnormal findings: Secondary | ICD-10-CM

## 2022-11-02 NOTE — Progress Notes (Addendum)
Presents to [facility] for 30 day hearing retest of possible STS in Right Ear.  On 10/09/2022 Paul Jacobs was sent to [facility] for evaluation by  Dr. Lanell Jacobs.  09/06/2022 hearing screen showed possible STS in left ear & 10/05/2022 hearing retest showed possible STS in right ear.  Exam:  NAD.  Both Tms are visualized with just some small shreds of cerumen attached to the walls of each canal.  No evidence of cerumen impaction that would impair hearing.  Assessment/Plan:  I would recommend his PCP or [facility] flush both of his ears out just to remove some of the superficial cerumen.  I have advised him on how to flush his ears periodically to preven cerumen buildup.  I recommend Paul Jacobs of [place] retest his hearing prior to 11/05/2022 as OSHA advises.  If the right STS resolves on retest, then no further evaluation is needed.  If STS persists on the right and there have been no new unprotected noise exposures in the interim, then I would advise making the new righ ear hearing test his new baseline.  Also, that would be non-recordable for OSHA standards.  On the other hand, if there is unprotected noise exposure between now & the retest and the shift persists, then I recommend he be sent back here for evaluation & further documentation.    10/29/2022 Paul Jacobs states he flew to [facility] & back home last week.  States ears stopped up.  On 10/16/2022 Paul Dell, PA-C advised Paul Jacobs to use Allegra for C/O runny nose & sinus drainage.  On 10/18/2022  Paul Dell, PA-C prescribed BromFed for cough.  Paul Jacobs states S/Sx have improved.  He continues to use Allegra.  He hasn't come to the clinic to have ears flushed.  States he's flushed them a few times per Dr. Aurelio Jacobs instructions.  States he flush them a few times over the next few days before he returns to [facility] on 11/02/2022 for Hearing Retest.  Presents today for  Hearing Retest for possible STS to right ear.  Today's hearing screen still has changes. Paul Jacobs states he has scheduled an appointment at [facility] for 11/20/2022 with Dr. Erline Jacobs.  Said the ringing in his ears has worsened, especially in the right ear.  Paul Jacobs to sign a Medical Release at [facility] so they will send their office notes to Paul Jacobs.  Explained they are not in [facility] & we can't see their notes.  Verbalized understanding.  Above information has been put in Workplace on today's screen for them to change right ear baseline.  AMD

## 2022-11-05 DIAGNOSIS — M5412 Radiculopathy, cervical region: Secondary | ICD-10-CM | POA: Diagnosis not present

## 2022-11-05 DIAGNOSIS — M9902 Segmental and somatic dysfunction of thoracic region: Secondary | ICD-10-CM | POA: Diagnosis not present

## 2022-11-05 DIAGNOSIS — M9903 Segmental and somatic dysfunction of lumbar region: Secondary | ICD-10-CM | POA: Diagnosis not present

## 2022-11-05 DIAGNOSIS — M9901 Segmental and somatic dysfunction of cervical region: Secondary | ICD-10-CM | POA: Diagnosis not present

## 2022-11-15 DIAGNOSIS — M5412 Radiculopathy, cervical region: Secondary | ICD-10-CM | POA: Diagnosis not present

## 2022-11-15 DIAGNOSIS — M9903 Segmental and somatic dysfunction of lumbar region: Secondary | ICD-10-CM | POA: Diagnosis not present

## 2022-11-15 DIAGNOSIS — M9902 Segmental and somatic dysfunction of thoracic region: Secondary | ICD-10-CM | POA: Diagnosis not present

## 2022-11-15 DIAGNOSIS — M9901 Segmental and somatic dysfunction of cervical region: Secondary | ICD-10-CM | POA: Diagnosis not present

## 2022-11-20 DIAGNOSIS — H9313 Tinnitus, bilateral: Secondary | ICD-10-CM | POA: Diagnosis not present

## 2022-11-20 DIAGNOSIS — H9311 Tinnitus, right ear: Secondary | ICD-10-CM | POA: Diagnosis not present

## 2022-12-03 DIAGNOSIS — M9903 Segmental and somatic dysfunction of lumbar region: Secondary | ICD-10-CM | POA: Diagnosis not present

## 2022-12-03 DIAGNOSIS — M5412 Radiculopathy, cervical region: Secondary | ICD-10-CM | POA: Diagnosis not present

## 2022-12-03 DIAGNOSIS — M9901 Segmental and somatic dysfunction of cervical region: Secondary | ICD-10-CM | POA: Diagnosis not present

## 2022-12-03 DIAGNOSIS — M9902 Segmental and somatic dysfunction of thoracic region: Secondary | ICD-10-CM | POA: Diagnosis not present

## 2022-12-11 ENCOUNTER — Ambulatory Visit: Payer: Self-pay

## 2022-12-11 DIAGNOSIS — Z23 Encounter for immunization: Secondary | ICD-10-CM

## 2022-12-12 DIAGNOSIS — L821 Other seborrheic keratosis: Secondary | ICD-10-CM | POA: Diagnosis not present

## 2022-12-12 DIAGNOSIS — L570 Actinic keratosis: Secondary | ICD-10-CM | POA: Diagnosis not present

## 2022-12-12 DIAGNOSIS — L814 Other melanin hyperpigmentation: Secondary | ICD-10-CM | POA: Diagnosis not present

## 2022-12-12 DIAGNOSIS — B078 Other viral warts: Secondary | ICD-10-CM | POA: Diagnosis not present

## 2022-12-12 DIAGNOSIS — L538 Other specified erythematous conditions: Secondary | ICD-10-CM | POA: Diagnosis not present

## 2022-12-12 DIAGNOSIS — L308 Other specified dermatitis: Secondary | ICD-10-CM | POA: Diagnosis not present

## 2022-12-12 DIAGNOSIS — D235 Other benign neoplasm of skin of trunk: Secondary | ICD-10-CM | POA: Diagnosis not present

## 2022-12-19 DIAGNOSIS — M9901 Segmental and somatic dysfunction of cervical region: Secondary | ICD-10-CM | POA: Diagnosis not present

## 2022-12-19 DIAGNOSIS — M9903 Segmental and somatic dysfunction of lumbar region: Secondary | ICD-10-CM | POA: Diagnosis not present

## 2022-12-19 DIAGNOSIS — M5412 Radiculopathy, cervical region: Secondary | ICD-10-CM | POA: Diagnosis not present

## 2022-12-19 DIAGNOSIS — M9902 Segmental and somatic dysfunction of thoracic region: Secondary | ICD-10-CM | POA: Diagnosis not present

## 2023-01-02 DIAGNOSIS — M9903 Segmental and somatic dysfunction of lumbar region: Secondary | ICD-10-CM | POA: Diagnosis not present

## 2023-01-02 DIAGNOSIS — M9902 Segmental and somatic dysfunction of thoracic region: Secondary | ICD-10-CM | POA: Diagnosis not present

## 2023-01-02 DIAGNOSIS — M9901 Segmental and somatic dysfunction of cervical region: Secondary | ICD-10-CM | POA: Diagnosis not present

## 2023-01-02 DIAGNOSIS — M5412 Radiculopathy, cervical region: Secondary | ICD-10-CM | POA: Diagnosis not present

## 2023-01-14 DIAGNOSIS — M9903 Segmental and somatic dysfunction of lumbar region: Secondary | ICD-10-CM | POA: Diagnosis not present

## 2023-01-14 DIAGNOSIS — M5412 Radiculopathy, cervical region: Secondary | ICD-10-CM | POA: Diagnosis not present

## 2023-01-14 DIAGNOSIS — M9901 Segmental and somatic dysfunction of cervical region: Secondary | ICD-10-CM | POA: Diagnosis not present

## 2023-01-14 DIAGNOSIS — M9902 Segmental and somatic dysfunction of thoracic region: Secondary | ICD-10-CM | POA: Diagnosis not present

## 2023-01-16 ENCOUNTER — Encounter: Payer: Self-pay | Admitting: Physician Assistant

## 2023-01-28 DIAGNOSIS — E109 Type 1 diabetes mellitus without complications: Secondary | ICD-10-CM | POA: Diagnosis not present

## 2023-01-28 DIAGNOSIS — B351 Tinea unguium: Secondary | ICD-10-CM | POA: Diagnosis not present

## 2023-01-28 DIAGNOSIS — L600 Ingrowing nail: Secondary | ICD-10-CM | POA: Diagnosis not present

## 2023-01-29 DIAGNOSIS — G8929 Other chronic pain: Secondary | ICD-10-CM | POA: Diagnosis not present

## 2023-01-29 DIAGNOSIS — M25511 Pain in right shoulder: Secondary | ICD-10-CM | POA: Diagnosis not present

## 2023-01-29 DIAGNOSIS — M5412 Radiculopathy, cervical region: Secondary | ICD-10-CM | POA: Diagnosis not present

## 2023-02-04 DIAGNOSIS — M9903 Segmental and somatic dysfunction of lumbar region: Secondary | ICD-10-CM | POA: Diagnosis not present

## 2023-02-04 DIAGNOSIS — M5412 Radiculopathy, cervical region: Secondary | ICD-10-CM | POA: Diagnosis not present

## 2023-02-04 DIAGNOSIS — M9902 Segmental and somatic dysfunction of thoracic region: Secondary | ICD-10-CM | POA: Diagnosis not present

## 2023-02-04 DIAGNOSIS — M9901 Segmental and somatic dysfunction of cervical region: Secondary | ICD-10-CM | POA: Diagnosis not present

## 2023-02-06 DIAGNOSIS — M47812 Spondylosis without myelopathy or radiculopathy, cervical region: Secondary | ICD-10-CM | POA: Diagnosis not present

## 2023-02-06 DIAGNOSIS — M5412 Radiculopathy, cervical region: Secondary | ICD-10-CM | POA: Diagnosis not present

## 2023-02-11 DIAGNOSIS — L600 Ingrowing nail: Secondary | ICD-10-CM | POA: Diagnosis not present

## 2023-02-11 DIAGNOSIS — B351 Tinea unguium: Secondary | ICD-10-CM | POA: Diagnosis not present

## 2023-02-11 DIAGNOSIS — E109 Type 1 diabetes mellitus without complications: Secondary | ICD-10-CM | POA: Diagnosis not present

## 2023-02-25 DIAGNOSIS — M5412 Radiculopathy, cervical region: Secondary | ICD-10-CM | POA: Diagnosis not present

## 2023-02-25 DIAGNOSIS — M9901 Segmental and somatic dysfunction of cervical region: Secondary | ICD-10-CM | POA: Diagnosis not present

## 2023-02-25 DIAGNOSIS — M9902 Segmental and somatic dysfunction of thoracic region: Secondary | ICD-10-CM | POA: Diagnosis not present

## 2023-02-25 DIAGNOSIS — M9903 Segmental and somatic dysfunction of lumbar region: Secondary | ICD-10-CM | POA: Diagnosis not present

## 2023-02-26 DIAGNOSIS — G8929 Other chronic pain: Secondary | ICD-10-CM | POA: Diagnosis not present

## 2023-02-26 DIAGNOSIS — M1712 Unilateral primary osteoarthritis, left knee: Secondary | ICD-10-CM | POA: Diagnosis not present

## 2023-02-26 DIAGNOSIS — M5412 Radiculopathy, cervical region: Secondary | ICD-10-CM | POA: Diagnosis not present

## 2023-02-26 DIAGNOSIS — M25562 Pain in left knee: Secondary | ICD-10-CM | POA: Diagnosis not present

## 2023-03-04 ENCOUNTER — Other Ambulatory Visit: Payer: Self-pay | Admitting: Family Medicine

## 2023-03-04 DIAGNOSIS — M5412 Radiculopathy, cervical region: Secondary | ICD-10-CM

## 2023-03-04 DIAGNOSIS — M4802 Spinal stenosis, cervical region: Secondary | ICD-10-CM | POA: Diagnosis not present

## 2023-03-05 DIAGNOSIS — M5412 Radiculopathy, cervical region: Secondary | ICD-10-CM | POA: Insufficient documentation

## 2023-03-08 DIAGNOSIS — M5412 Radiculopathy, cervical region: Secondary | ICD-10-CM | POA: Diagnosis not present

## 2023-03-12 ENCOUNTER — Other Ambulatory Visit: Payer: 59

## 2023-03-12 ENCOUNTER — Ambulatory Visit
Admission: RE | Admit: 2023-03-12 | Discharge: 2023-03-12 | Disposition: A | Discharge status: Home / Self Care | Payer: 59 | Source: Ambulatory Visit | Attending: Family Medicine | Admitting: Family Medicine

## 2023-03-12 DIAGNOSIS — M5412 Radiculopathy, cervical region: Secondary | ICD-10-CM

## 2023-03-12 DIAGNOSIS — M47812 Spondylosis without myelopathy or radiculopathy, cervical region: Secondary | ICD-10-CM | POA: Diagnosis not present

## 2023-03-12 DIAGNOSIS — M4802 Spinal stenosis, cervical region: Secondary | ICD-10-CM | POA: Diagnosis not present

## 2023-03-18 DIAGNOSIS — M9903 Segmental and somatic dysfunction of lumbar region: Secondary | ICD-10-CM | POA: Diagnosis not present

## 2023-03-18 DIAGNOSIS — M5412 Radiculopathy, cervical region: Secondary | ICD-10-CM | POA: Diagnosis not present

## 2023-03-18 DIAGNOSIS — M9901 Segmental and somatic dysfunction of cervical region: Secondary | ICD-10-CM | POA: Diagnosis not present

## 2023-03-18 DIAGNOSIS — M9902 Segmental and somatic dysfunction of thoracic region: Secondary | ICD-10-CM | POA: Diagnosis not present

## 2023-03-26 DIAGNOSIS — M4802 Spinal stenosis, cervical region: Secondary | ICD-10-CM | POA: Diagnosis not present

## 2023-03-26 DIAGNOSIS — M5412 Radiculopathy, cervical region: Secondary | ICD-10-CM | POA: Diagnosis not present

## 2023-04-08 DIAGNOSIS — M9902 Segmental and somatic dysfunction of thoracic region: Secondary | ICD-10-CM | POA: Diagnosis not present

## 2023-04-08 DIAGNOSIS — M5412 Radiculopathy, cervical region: Secondary | ICD-10-CM | POA: Diagnosis not present

## 2023-04-08 DIAGNOSIS — M9901 Segmental and somatic dysfunction of cervical region: Secondary | ICD-10-CM | POA: Diagnosis not present

## 2023-04-08 DIAGNOSIS — M9903 Segmental and somatic dysfunction of lumbar region: Secondary | ICD-10-CM | POA: Diagnosis not present

## 2023-04-16 ENCOUNTER — Other Ambulatory Visit: Payer: Self-pay | Admitting: Physician Assistant

## 2023-04-16 DIAGNOSIS — M4802 Spinal stenosis, cervical region: Secondary | ICD-10-CM | POA: Diagnosis not present

## 2023-04-16 DIAGNOSIS — E119 Type 2 diabetes mellitus without complications: Secondary | ICD-10-CM

## 2023-04-16 DIAGNOSIS — M5412 Radiculopathy, cervical region: Secondary | ICD-10-CM | POA: Diagnosis not present

## 2023-04-17 ENCOUNTER — Encounter: Payer: Self-pay | Admitting: Physician Assistant

## 2023-04-29 DIAGNOSIS — M9902 Segmental and somatic dysfunction of thoracic region: Secondary | ICD-10-CM | POA: Diagnosis not present

## 2023-04-29 DIAGNOSIS — M9903 Segmental and somatic dysfunction of lumbar region: Secondary | ICD-10-CM | POA: Diagnosis not present

## 2023-04-29 DIAGNOSIS — M5412 Radiculopathy, cervical region: Secondary | ICD-10-CM | POA: Diagnosis not present

## 2023-04-29 DIAGNOSIS — M9901 Segmental and somatic dysfunction of cervical region: Secondary | ICD-10-CM | POA: Diagnosis not present

## 2023-05-20 DIAGNOSIS — M9902 Segmental and somatic dysfunction of thoracic region: Secondary | ICD-10-CM | POA: Diagnosis not present

## 2023-05-20 DIAGNOSIS — M9903 Segmental and somatic dysfunction of lumbar region: Secondary | ICD-10-CM | POA: Diagnosis not present

## 2023-05-20 DIAGNOSIS — M5412 Radiculopathy, cervical region: Secondary | ICD-10-CM | POA: Diagnosis not present

## 2023-05-20 DIAGNOSIS — M9901 Segmental and somatic dysfunction of cervical region: Secondary | ICD-10-CM | POA: Diagnosis not present

## 2023-05-27 ENCOUNTER — Encounter: Payer: Self-pay | Admitting: Physician Assistant

## 2023-05-27 ENCOUNTER — Ambulatory Visit: Payer: Self-pay | Admitting: Physician Assistant

## 2023-05-27 DIAGNOSIS — B070 Plantar wart: Secondary | ICD-10-CM

## 2023-05-27 NOTE — Progress Notes (Signed)
 Pt presents today for left foot discomfort. Pt has wart on bottom of foot near toes. Also side of pinky toe more on foot tends to burn from time to time. Gretel Acre

## 2023-05-27 NOTE — Progress Notes (Signed)
   Subjective: Left plantar foot pain    Patient ID: Paul Jacobs, male    DOB: [birth year], 53 y.o.   MRN: [id number]  HPI Patient complaining of plantar foot pain for few weeks.  Patient noticed callus formation at pain site.  Patient is scheduled to see podiatry next month but noticed this complaint is worsening.  Able to ambulate with only mild discomfort.    Review of Systems Diabetes and hypothyroidism.    Objective:   Physical Exam  Examination of the  plantar aspect of the left foot reveals hypertrophic skin consistent with plantar wart.      Assessment & Plan: Left plantar wart   Patient was amenable to 1 session of cryotherapy and will purchase plantar wart pads until seen by podiatry.

## 2023-06-10 DIAGNOSIS — M5412 Radiculopathy, cervical region: Secondary | ICD-10-CM | POA: Diagnosis not present

## 2023-06-10 DIAGNOSIS — M9903 Segmental and somatic dysfunction of lumbar region: Secondary | ICD-10-CM | POA: Diagnosis not present

## 2023-06-10 DIAGNOSIS — M9901 Segmental and somatic dysfunction of cervical region: Secondary | ICD-10-CM | POA: Diagnosis not present

## 2023-06-10 DIAGNOSIS — M9902 Segmental and somatic dysfunction of thoracic region: Secondary | ICD-10-CM | POA: Diagnosis not present

## 2023-06-28 DIAGNOSIS — M5412 Radiculopathy, cervical region: Secondary | ICD-10-CM | POA: Diagnosis not present

## 2023-06-28 DIAGNOSIS — M4802 Spinal stenosis, cervical region: Secondary | ICD-10-CM | POA: Diagnosis not present

## 2023-07-01 DIAGNOSIS — M9903 Segmental and somatic dysfunction of lumbar region: Secondary | ICD-10-CM | POA: Diagnosis not present

## 2023-07-01 DIAGNOSIS — M5412 Radiculopathy, cervical region: Secondary | ICD-10-CM | POA: Diagnosis not present

## 2023-07-01 DIAGNOSIS — M9902 Segmental and somatic dysfunction of thoracic region: Secondary | ICD-10-CM | POA: Diagnosis not present

## 2023-07-01 DIAGNOSIS — M9901 Segmental and somatic dysfunction of cervical region: Secondary | ICD-10-CM | POA: Diagnosis not present

## 2023-07-18 ENCOUNTER — Other Ambulatory Visit: Payer: Self-pay | Admitting: Physician Assistant

## 2023-07-19 DIAGNOSIS — M4802 Spinal stenosis, cervical region: Secondary | ICD-10-CM | POA: Diagnosis not present

## 2023-07-19 DIAGNOSIS — M5412 Radiculopathy, cervical region: Secondary | ICD-10-CM | POA: Diagnosis not present

## 2023-07-19 DIAGNOSIS — L600 Ingrowing nail: Secondary | ICD-10-CM | POA: Diagnosis not present

## 2023-07-19 DIAGNOSIS — B351 Tinea unguium: Secondary | ICD-10-CM | POA: Diagnosis not present

## 2023-07-19 DIAGNOSIS — M79675 Pain in left toe(s): Secondary | ICD-10-CM | POA: Diagnosis not present

## 2023-07-20 ENCOUNTER — Other Ambulatory Visit: Payer: Self-pay | Admitting: Physician Assistant

## 2023-07-20 DIAGNOSIS — E109 Type 1 diabetes mellitus without complications: Secondary | ICD-10-CM

## 2023-07-22 ENCOUNTER — Other Ambulatory Visit: Payer: Self-pay

## 2023-07-22 DIAGNOSIS — E109 Type 1 diabetes mellitus without complications: Secondary | ICD-10-CM

## 2023-07-22 DIAGNOSIS — M9902 Segmental and somatic dysfunction of thoracic region: Secondary | ICD-10-CM | POA: Diagnosis not present

## 2023-07-22 DIAGNOSIS — M9901 Segmental and somatic dysfunction of cervical region: Secondary | ICD-10-CM | POA: Diagnosis not present

## 2023-07-22 DIAGNOSIS — M5412 Radiculopathy, cervical region: Secondary | ICD-10-CM | POA: Diagnosis not present

## 2023-07-22 DIAGNOSIS — M9903 Segmental and somatic dysfunction of lumbar region: Secondary | ICD-10-CM | POA: Diagnosis not present

## 2023-07-22 MED ORDER — NOVOLOG FLEXPEN 100 UNIT/ML ~~LOC~~ SOPN: PEN_INJECTOR | SUBCUTANEOUS | 5 refills | Status: AC

## 2023-08-02 DIAGNOSIS — M79675 Pain in left toe(s): Secondary | ICD-10-CM | POA: Diagnosis not present

## 2023-08-02 DIAGNOSIS — E109 Type 1 diabetes mellitus without complications: Secondary | ICD-10-CM | POA: Diagnosis not present

## 2023-08-02 DIAGNOSIS — B351 Tinea unguium: Secondary | ICD-10-CM | POA: Diagnosis not present

## 2023-08-12 DIAGNOSIS — M9902 Segmental and somatic dysfunction of thoracic region: Secondary | ICD-10-CM | POA: Diagnosis not present

## 2023-08-12 DIAGNOSIS — M9901 Segmental and somatic dysfunction of cervical region: Secondary | ICD-10-CM | POA: Diagnosis not present

## 2023-08-12 DIAGNOSIS — M9903 Segmental and somatic dysfunction of lumbar region: Secondary | ICD-10-CM | POA: Diagnosis not present

## 2023-08-12 DIAGNOSIS — M5412 Radiculopathy, cervical region: Secondary | ICD-10-CM | POA: Diagnosis not present

## 2023-08-19 DIAGNOSIS — M9902 Segmental and somatic dysfunction of thoracic region: Secondary | ICD-10-CM | POA: Diagnosis not present

## 2023-08-19 DIAGNOSIS — M9903 Segmental and somatic dysfunction of lumbar region: Secondary | ICD-10-CM | POA: Diagnosis not present

## 2023-08-19 DIAGNOSIS — M9901 Segmental and somatic dysfunction of cervical region: Secondary | ICD-10-CM | POA: Diagnosis not present

## 2023-08-19 DIAGNOSIS — M5412 Radiculopathy, cervical region: Secondary | ICD-10-CM | POA: Diagnosis not present

## 2023-08-30 DIAGNOSIS — M4722 Other spondylosis with radiculopathy, cervical region: Secondary | ICD-10-CM | POA: Diagnosis not present

## 2023-08-30 DIAGNOSIS — M4312 Spondylolisthesis, cervical region: Secondary | ICD-10-CM | POA: Diagnosis not present

## 2023-09-02 DIAGNOSIS — M5412 Radiculopathy, cervical region: Secondary | ICD-10-CM | POA: Diagnosis not present

## 2023-09-02 DIAGNOSIS — M9903 Segmental and somatic dysfunction of lumbar region: Secondary | ICD-10-CM | POA: Diagnosis not present

## 2023-09-02 DIAGNOSIS — M9901 Segmental and somatic dysfunction of cervical region: Secondary | ICD-10-CM | POA: Diagnosis not present

## 2023-09-02 DIAGNOSIS — M9902 Segmental and somatic dysfunction of thoracic region: Secondary | ICD-10-CM | POA: Diagnosis not present

## 2023-09-03 DIAGNOSIS — G5631 Lesion of radial nerve, right upper limb: Secondary | ICD-10-CM | POA: Diagnosis not present

## 2023-09-03 DIAGNOSIS — M25521 Pain in right elbow: Secondary | ICD-10-CM | POA: Diagnosis not present

## 2023-09-03 DIAGNOSIS — M7712 Lateral epicondylitis, left elbow: Secondary | ICD-10-CM | POA: Diagnosis not present

## 2023-09-03 DIAGNOSIS — M25522 Pain in left elbow: Secondary | ICD-10-CM | POA: Diagnosis not present

## 2023-09-03 DIAGNOSIS — M778 Other enthesopathies, not elsewhere classified: Secondary | ICD-10-CM | POA: Diagnosis not present

## 2023-09-12 ENCOUNTER — Ambulatory Visit: Payer: Self-pay

## 2023-09-12 DIAGNOSIS — Z794 Long term (current) use of insulin: Secondary | ICD-10-CM

## 2023-09-12 DIAGNOSIS — Z0000 Encounter for general adult medical examination without abnormal findings: Secondary | ICD-10-CM

## 2023-09-12 LAB — POCT URINALYSIS DIPSTICK
Bilirubin, UA: NEGATIVE
Blood, UA: NEGATIVE
Glucose, UA: NEGATIVE
Ketones, UA: NEGATIVE
Leukocytes, UA: NEGATIVE
Nitrite, UA: NEGATIVE
Protein, UA: NEGATIVE
Spec Grav, UA: 1.015 (ref 1.010–1.025)
Urobilinogen, UA: 0.2 U/dL
pH, UA: 6 (ref 5.0–8.0)

## 2023-09-12 NOTE — Progress Notes (Signed)
Pt completed labs for scheduled physical. Paul Jacobs

## 2023-09-16 ENCOUNTER — Encounter: Payer: Self-pay | Admitting: Physician Assistant

## 2023-09-16 ENCOUNTER — Ambulatory Visit: Payer: Self-pay | Admitting: Physician Assistant

## 2023-09-16 VITALS — BP 127/71 | HR 69 | Temp 97.7°F | Resp 16 | Wt 171.0 lb

## 2023-09-16 DIAGNOSIS — Z0000 Encounter for general adult medical examination without abnormal findings: Secondary | ICD-10-CM

## 2023-09-16 NOTE — Progress Notes (Signed)
 [facility] ____________________________________________   None    (approximate)  I have reviewed the triage vital signs and the nursing notes.   HISTORY  Chief Complaint Annual Exam   HPI Paul Jacobs is a 53 y.o. male patient presents for annual physical exam.  Voices no concerns or complaints.         Past Medical History:  Diagnosis Date   Diabetes mellitus, type 2 (HCC)    History of chicken pox    History of kidney stones    Hypothyroidism    Low HDL (under 40)    Tinea corporis     Patient Active Problem List   Diagnosis Date Noted   Cervical radiculopathy 03/05/2023   Other neutropenia (HCC) 05/15/2022   Degenerative tear of glenoid labrum of left shoulder 07/10/2021   DJD of left AC (acromioclavicular) joint 07/10/2021   Rotator cuff tendinitis, left 07/10/2021   Fracture of distal phalanx of finger 01/04/2020   Digital mucous cyst of right hand 04/08/2019   Cervical spine pain 11/03/2018   Strain of left trapezius muscle 11/03/2018   Other hyperlipidemia 09/02/2018   Onychomycosis 09/02/2018   Type 1 diabetes mellitus (HCC) 09/10/2013   Long-term insulin  use (HCC) 09/08/2013   Type II diabetes mellitus (HCC) 08/25/2013   Hypothyroidism 08/25/2013    Past Surgical History:  Procedure Laterality Date   COLONOSCOPY WITH PROPOFOL  N/A 04/24/2021   Procedure: COLONOSCOPY WITH PROPOFOL ;  Surgeon: Onita Elspeth Sharper, DO;  Location: [facility] ENDOSCOPY;  Service: Gastroenterology;  Laterality: N/A;  IDDM   INGUINAL HERNIA REPAIR Bilateral 2005    Prior to Admission medications   Medication Sig Start Date End Date Taking? Authorizing Provider  aspirin 325 MG tablet aspirin 325 mg tablet   Yes [provider]  blood glucose meter kit and supplies KIT Dispense based on patient and insurance preference. Use up to four times daily as directed. 08/20/22  Yes Claudene Tanda POUR, PA-C  clobetasol  cream (TEMOVATE ) 0.05 %  Apply 1 Application topically 2 (two) times daily. Patient not taking: Reported on 09/12/2022 04/30/22   Claudene Tanda POUR, PA-C  glucose blood (CONTOUR NEXT TEST) test strip TEST 4 TIMES DAILY 06/18/16   [provider]  glucose blood (FREESTYLE LITE) test strip USE ONE STRIP TO TEST FOUR TIMES A DAY 09/14/22   Claudene Tanda POUR, PA-C  insulin  aspart (NOVOLOG  FLEXPEN) 100 UNIT/ML FlexPen INJECT 5 TO 7 UNITS UNDER THE SKIN THREE TIMES A DAY BEFORE A MEAL (USE 1:15 SLIDING SCALE IF >180) 07/22/23   Claudene Tanda POUR, PA-C  Insulin  Pen Needle (BD PEN NEEDLE NANO 2ND GEN) 32G X 4 MM MISC Use With Insulin  Pen Four Times A Day 09/12/22   Claudene Tanda POUR, PA-C  LANTUS  SOLOSTAR 100 UNIT/ML Solostar Pen INJECT 15 UNITS INTO SKIN DAILY 04/16/23   Claudene Tanda POUR, PA-C  levothyroxine  (SYNTHROID ) 125 MCG tablet TAKE 1 TABLET BY MOUTH DAILY 07/18/23   Claudene Tanda POUR, PA-C  meloxicam (MOBIC) 15 MG tablet Take 1 tablet by mouth daily as needed. 10/06/17   [provider]  meloxicam (MOBIC) 7.5 MG tablet Take 7.5 mg by mouth 2 (two) times daily. 09/14/19   [provider]  metFORMIN  (GLUCOPHAGE ) 1000 MG tablet TAKE 1 TABLET BY MOUTH 2 TIMES A DAY 04/16/23   Claudene Tanda POUR, PA-C  Omega-3 Fatty Acids (FISH OIL PO) Take 1 capsule by mouth daily.    [provider]  Pneumococcal Vac Polyvalent (PNEUMOVAX  23 IJ) Pneumovax-23 25 mcg/0.5 mL injection syringe Patient not taking: Reported on 09/06/2021    [provider]  sildenafil  (REVATIO ) 20 MG tablet Take 1 tablet (20 mg total) by mouth 3 (three) times daily. 1-5 tablets PRN 05/29/22   Claudene Tanda POUR, PA-C  tadalafil (CIALIS) 5 MG tablet Take by mouth. 04/14/20   [provider]  terbinafine  (LAMISIL  AT) 1 % cream Apply 1 Application topically 2 (two) times daily. Patient not taking: Reported on 09/12/2022 05/29/22   Claudene Tanda POUR, PA-C    Allergies Patient has no known allergies.  Family History  Problem Relation Age of Onset    Diabetes Father    Diabetes Maternal Grandmother    Leukemia Maternal Great-grandmother     Social History Social History   Tobacco Use   Smoking status: Never   Smokeless tobacco: Former    Types: Chew   Tobacco comments:    Tried a few cigarettes in high school   Vaping Use   Vaping status: Never Used  Substance Use Topics   Alcohol use: Yes    Comment: very seldom   Drug use: Never    Review of Systems Constitutional: No fever/chills Eyes: No visual changes. ENT: No sore throat. Cardiovascular: Denies chest pain. Respiratory: Denies shortness of breath. Gastrointestinal: No abdominal pain.  No nausea, no vomiting.  No diarrhea.  No constipation. Genitourinary: Negative for dysuria. Musculoskeletal: Negative for back pain. Skin: Negative for rash. Neurological: Negative for headaches, focal weakness or numbness. Endocrine: Diabetes and hypothyroidism  ____________________________________________   PHYSICAL EXAM:  VITAL SIGNS: BP 127/71  Pulse Rate 69  Temp 97.7 F (36.5 C)  Weight 171 lb (77.6 kg)  Resp 16  SpO2 97 %   BMI: 21.96 kg/m2  BSA: 2.01 m2   Constitutional: Alert and oriented. Well appearing and in no acute distress. Eyes: Conjunctivae are normal. PERRL. EOMI. Head: Atraumatic. Nose: No congestion/rhinnorhea. Mouth/Throat: Mucous membranes are moist.  Oropharynx non-erythematous. Neck: No stridor.  No cervical spine tenderness to palpation. Hematological/Lymphatic/Immunilogical: No cervical lymphadenopathy. Cardiovascular: Normal rate, regular rhythm. Grossly normal heart sounds.  Good peripheral circulation. Respiratory: Normal respiratory effort.  No retractions. Lungs CTAB. Gastrointestinal: Soft and nontender. No distention. No abdominal bruits. No CVA tenderness. Genitourinary: Deferred Musculoskeletal: No lower extremity tenderness nor edema.  No joint effusions. Neurologic:  Normal speech and language. No gross focal neurologic  deficits are appreciated. No gait instability. Skin:  Skin is warm, dry and intact. No rash noted. Psychiatric: Mood and affect are normal. Speech and behavior are normal.  ____________________________________________   LABS           Component Ref Range & Units (hover) 4 d ago (09/12/23) 1 yr ago (09/12/22) 1 yr ago (09/06/22) 2 yr ago (08/30/21) 3 yr ago (08/23/20) 4 yr ago (08/26/19) 5 yr ago (08/28/18)  Color, UA yellow Light Yellow Dark Yellow Yellow yellow Yellow Light Yellow  Clarity, UA clear Clear Clear Clear clear Clear Clear  Glucose, UA Negative Negative Negative Negative Negative Positive Abnormal  CM Negative  Bilirubin, UA neg Negative Negative Negative negative Negative Negative  Ketones, UA neg Negative Negative Negative negative Negative Negative  Spec Grav, UA 1.015 <=1.005 Abnormal  1.015 1.020 1.020 >=1.030 Abnormal  1.010  Blood, UA neg Negative Positive CM Negative positive 2+ Positive CM 2+  pH, UA 6.0 6.0 6.0 6.0 6.0 5.5 6.0  Protein, UA Negative Negative Negative Negative Negative Negative Negative  Urobilinogen, UA 0.2 0.2 0.2 0.2 0.2 0.2  0.2  Nitrite, UA neg Negative Negative Negative negative Negative Negative  Leukocytes, UA Negative Negative Negative Negative Negative Negative Negative  Appearance     medium    Odor                 Pending ____________________________________________  EKG  Bradycardic at 51 bpm ____________________________________________    ____________________________________________   INITIAL IMPRESSION / ASSESSMENT AND PLAN  As part of my medical decision making, I reviewed the following data within the electronic MEDICAL RECORD NUMBER      No acute findings on physical exam or EKG.  Labs pending.        ____________________________________________   FINAL CLINICAL IMPRESSION Well exam  ED Discharge Orders     None        Note:  This document was prepared using Dragon voice recognition software and may  include unintentional dictation errors.

## 2023-09-16 NOTE — Progress Notes (Signed)
 Here for yearly physical with [facility] provider.  Denies any complaints and takes meds as ordered.  CBG daily at home and reports 89 today and stays active.  No new complaints.

## 2023-09-23 DIAGNOSIS — M5412 Radiculopathy, cervical region: Secondary | ICD-10-CM | POA: Diagnosis not present

## 2023-09-23 DIAGNOSIS — M9902 Segmental and somatic dysfunction of thoracic region: Secondary | ICD-10-CM | POA: Diagnosis not present

## 2023-09-23 DIAGNOSIS — M9901 Segmental and somatic dysfunction of cervical region: Secondary | ICD-10-CM | POA: Diagnosis not present

## 2023-09-23 DIAGNOSIS — M9903 Segmental and somatic dysfunction of lumbar region: Secondary | ICD-10-CM | POA: Diagnosis not present

## 2023-09-24 ENCOUNTER — Ambulatory Visit: Payer: Self-pay | Admitting: Physician Assistant

## 2023-09-24 ENCOUNTER — Encounter: Payer: Self-pay | Admitting: Physician Assistant

## 2023-09-24 DIAGNOSIS — S46912A Strain of unspecified muscle, fascia and tendon at shoulder and upper arm level, left arm, initial encounter: Secondary | ICD-10-CM

## 2023-09-24 MED ORDER — NAPROXEN 500 MG PO TABS: 500.0000 mg | ORAL_TABLET | Freq: Two times a day (BID) | ORAL | 0 refills | Status: AC

## 2023-09-24 MED ORDER — ORPHENADRINE CITRATE ER 100 MG PO TB12: 100.0000 mg | ORAL_TABLET | Freq: Two times a day (BID) | ORAL | 0 refills | Status: AC

## 2023-09-24 NOTE — Progress Notes (Signed)
   Subjective: Left scapular pain    Patient ID: Paul Jacobs, male    DOB: 10-Jun-1970, 53 y.o.   MRN: [id number]  HPI Patient complaining of left scapular pain for about 1 week.  Patient states heavy lifting and pushing increases pain.  Patient described pain as sore and spasmatic.  No loss of strength or loss of function.  Patient is right-hand dominant.  No palliative measure for complaint.   Review of Systems Diabetes, cervical radiculopathy to the right upper extremity, and hypothyroidism    Objective:   Physical Exam Vital signs not recorded. Examination of the left scapular area reveals no ecchymosis, edema, or erythema.  Patient has moderate guarding to the medial aspect of the left scapular area.  Patient has full and equal range of motion of the left shoulder.  Strength against resistance is equal at 5/5.  Neurovascular intact.       Assessment & Plan: Left scapulary muscle strain   Patient given prescription for Norflex  and naproxen .  Patient advised to apply heat to the area at the end of the day.  Advised to follow-up in 7 to 10 days if no improvement or worsening complaint.

## 2023-10-10 ENCOUNTER — Other Ambulatory Visit: Payer: Self-pay | Admitting: Physician Assistant

## 2023-10-10 ENCOUNTER — Encounter: Payer: Self-pay | Admitting: Physician Assistant

## 2023-10-10 DIAGNOSIS — E109 Type 1 diabetes mellitus without complications: Secondary | ICD-10-CM

## 2023-10-11 ENCOUNTER — Other Ambulatory Visit: Payer: Self-pay

## 2023-10-11 DIAGNOSIS — E109 Type 1 diabetes mellitus without complications: Secondary | ICD-10-CM

## 2023-10-12 LAB — CMP12+LP+TP+TSH+6AC+PSA+CBC…
ALT: 15 IU/L (ref 0–44)
AST: 12 IU/L (ref 0–40)
Albumin: 3.9 g/dL (ref 3.8–4.9)
Alkaline Phosphatase: 49 IU/L (ref 44–121)
BUN/Creatinine Ratio: 20 (ref 9–20)
BUN: 13 mg/dL (ref 6–24)
Basophils Absolute: 0.1 x10E3/uL (ref 0.0–0.2)
Basos: 1 %
Bilirubin Total: 0.4 mg/dL (ref 0.0–1.2)
Calcium: 9 mg/dL (ref 8.7–10.2)
Chloride: 103 mmol/L (ref 96–106)
Chol/HDL Ratio: 2.5 ratio (ref 0.0–5.0)
Cholesterol, Total: 150 mg/dL (ref 100–199)
Creatinine, Ser: 0.64 mg/dL — ABNORMAL LOW (ref 0.76–1.27)
EOS (ABSOLUTE): 0.2 x10E3/uL (ref 0.0–0.4)
Eos: 4 %
Estimated CHD Risk: <0.5 times avg. (ref 0.0–1.0)
Free Thyroxine Index: 2.8 (ref 1.2–4.9)
GGT: 9 IU/L (ref 0–65)
Globulin, Total: 1.8 g/dL (ref 1.5–4.5)
Glucose: 137 mg/dL — ABNORMAL HIGH (ref 70–99)
HDL: 59 mg/dL (ref >39)
Hematocrit: 41 % (ref 37.5–51.0)
Hemoglobin: 13.9 g/dL (ref 13.0–17.7)
Immature Grans (Abs): 0 x10E3/uL (ref 0.0–0.1)
Immature Granulocytes: 0 %
Iron: 91 ug/dL (ref 38–169)
LDH: 115 IU/L — ABNORMAL LOW (ref 121–224)
LDL Chol Calc (NIH): 83 mg/dL (ref 0–99)
Lymphocytes Absolute: 1.7 x10E3/uL (ref 0.7–3.1)
Lymphs: 37 %
MCH: 31 pg (ref 26.6–33.0)
MCHC: 33.9 g/dL (ref 31.5–35.7)
MCV: 92 fL (ref 79–97)
Monocytes Absolute: 0.4 x10E3/uL (ref 0.1–0.9)
Monocytes: 8 %
Neutrophils Absolute: 2.3 x10E3/uL (ref 1.4–7.0)
Neutrophils: 50 %
Phosphorus: 3.7 mg/dL (ref 2.8–4.1)
Platelets: 305 x10E3/uL (ref 150–450)
Potassium: 4 mmol/L (ref 3.5–5.2)
Prostate Specific Ag, Serum: 0.8 ng/mL (ref 0.0–4.0)
RBC: 4.48 x10E6/uL (ref 4.14–5.80)
RDW: 12.8 % (ref 11.6–15.4)
Sodium: 139 mmol/L (ref 134–144)
T3 Uptake Ratio: 28 % (ref 24–39)
T4, Total: 10 ug/dL (ref 4.5–12.0)
TSH: 0.491 u[IU]/mL (ref 0.450–4.500)
Total Protein: 5.7 g/dL — ABNORMAL LOW (ref 6.0–8.5)
Triglycerides: 35 mg/dL (ref 0–149)
Uric Acid: 4.5 mg/dL (ref 3.8–8.4)
VLDL Cholesterol Cal: 8 mg/dL (ref 5–40)
WBC: 4.7 x10E3/uL (ref 3.4–10.8)
eGFR: 112 mL/min/1.73 (ref >59)

## 2023-10-12 LAB — HGB A1C W/O EAG: Hgb A1c MFr Bld: 5.9 % — ABNORMAL HIGH (ref 4.8–5.6)

## 2023-10-14 DIAGNOSIS — M5412 Radiculopathy, cervical region: Secondary | ICD-10-CM | POA: Diagnosis not present

## 2023-10-14 DIAGNOSIS — M9903 Segmental and somatic dysfunction of lumbar region: Secondary | ICD-10-CM | POA: Diagnosis not present

## 2023-10-14 DIAGNOSIS — M9902 Segmental and somatic dysfunction of thoracic region: Secondary | ICD-10-CM | POA: Diagnosis not present

## 2023-10-14 DIAGNOSIS — M9901 Segmental and somatic dysfunction of cervical region: Secondary | ICD-10-CM | POA: Diagnosis not present

## 2023-11-04 DIAGNOSIS — M9902 Segmental and somatic dysfunction of thoracic region: Secondary | ICD-10-CM | POA: Diagnosis not present

## 2023-11-04 DIAGNOSIS — M5412 Radiculopathy, cervical region: Secondary | ICD-10-CM | POA: Diagnosis not present

## 2023-11-04 DIAGNOSIS — M9903 Segmental and somatic dysfunction of lumbar region: Secondary | ICD-10-CM | POA: Diagnosis not present

## 2023-11-04 DIAGNOSIS — M9901 Segmental and somatic dysfunction of cervical region: Secondary | ICD-10-CM | POA: Diagnosis not present

## 2023-11-25 DIAGNOSIS — M9903 Segmental and somatic dysfunction of lumbar region: Secondary | ICD-10-CM | POA: Diagnosis not present

## 2023-11-25 DIAGNOSIS — M9902 Segmental and somatic dysfunction of thoracic region: Secondary | ICD-10-CM | POA: Diagnosis not present

## 2023-11-25 DIAGNOSIS — M5412 Radiculopathy, cervical region: Secondary | ICD-10-CM | POA: Diagnosis not present

## 2023-11-25 DIAGNOSIS — M9901 Segmental and somatic dysfunction of cervical region: Secondary | ICD-10-CM | POA: Diagnosis not present

## 2023-11-29 DIAGNOSIS — M778 Other enthesopathies, not elsewhere classified: Secondary | ICD-10-CM | POA: Diagnosis not present

## 2023-11-29 DIAGNOSIS — G5631 Lesion of radial nerve, right upper limb: Secondary | ICD-10-CM | POA: Diagnosis not present

## 2023-11-29 DIAGNOSIS — M79631 Pain in right forearm: Secondary | ICD-10-CM | POA: Diagnosis not present

## 2023-12-16 DIAGNOSIS — M9902 Segmental and somatic dysfunction of thoracic region: Secondary | ICD-10-CM | POA: Diagnosis not present

## 2023-12-16 DIAGNOSIS — M9901 Segmental and somatic dysfunction of cervical region: Secondary | ICD-10-CM | POA: Diagnosis not present

## 2023-12-16 DIAGNOSIS — M9903 Segmental and somatic dysfunction of lumbar region: Secondary | ICD-10-CM | POA: Diagnosis not present

## 2023-12-16 DIAGNOSIS — M5412 Radiculopathy, cervical region: Secondary | ICD-10-CM | POA: Diagnosis not present

## 2023-12-18 DIAGNOSIS — L821 Other seborrheic keratosis: Secondary | ICD-10-CM | POA: Diagnosis not present

## 2023-12-18 DIAGNOSIS — M25421 Effusion, right elbow: Secondary | ICD-10-CM | POA: Diagnosis not present

## 2023-12-18 DIAGNOSIS — G5631 Lesion of radial nerve, right upper limb: Secondary | ICD-10-CM | POA: Diagnosis not present

## 2023-12-18 DIAGNOSIS — M778 Other enthesopathies, not elsewhere classified: Secondary | ICD-10-CM | POA: Diagnosis not present

## 2023-12-18 DIAGNOSIS — L570 Actinic keratosis: Secondary | ICD-10-CM | POA: Diagnosis not present

## 2023-12-18 DIAGNOSIS — D1801 Hemangioma of skin and subcutaneous tissue: Secondary | ICD-10-CM | POA: Diagnosis not present

## 2023-12-18 DIAGNOSIS — M7711 Lateral epicondylitis, right elbow: Secondary | ICD-10-CM | POA: Diagnosis not present

## 2023-12-18 DIAGNOSIS — M25521 Pain in right elbow: Secondary | ICD-10-CM | POA: Diagnosis not present

## 2023-12-20 ENCOUNTER — Other Ambulatory Visit: Payer: Self-pay | Admitting: Sports Medicine

## 2023-12-20 DIAGNOSIS — G5631 Lesion of radial nerve, right upper limb: Secondary | ICD-10-CM

## 2023-12-20 DIAGNOSIS — M25421 Effusion, right elbow: Secondary | ICD-10-CM

## 2023-12-20 DIAGNOSIS — M7711 Lateral epicondylitis, right elbow: Secondary | ICD-10-CM

## 2023-12-20 DIAGNOSIS — M778 Other enthesopathies, not elsewhere classified: Secondary | ICD-10-CM

## 2023-12-23 ENCOUNTER — Other Ambulatory Visit: Payer: Self-pay

## 2023-12-23 ENCOUNTER — Encounter: Payer: Self-pay | Admitting: Physician Assistant

## 2023-12-23 DIAGNOSIS — Z794 Long term (current) use of insulin: Secondary | ICD-10-CM

## 2023-12-23 MED ORDER — BD PEN NEEDLE NANO 2ND GEN 32G X 4 MM MISC: 2 refills | Status: AC

## 2023-12-30 ENCOUNTER — Ambulatory Visit
Admission: RE | Admit: 2023-12-30 | Discharge: 2023-12-30 | Disposition: A | Discharge status: Home / Self Care | Source: Ambulatory Visit | Attending: Sports Medicine | Admitting: Sports Medicine

## 2023-12-30 DIAGNOSIS — M25521 Pain in right elbow: Secondary | ICD-10-CM | POA: Diagnosis not present

## 2023-12-30 DIAGNOSIS — G5631 Lesion of radial nerve, right upper limb: Secondary | ICD-10-CM | POA: Diagnosis not present

## 2023-12-30 DIAGNOSIS — M7711 Lateral epicondylitis, right elbow: Secondary | ICD-10-CM | POA: Diagnosis not present

## 2023-12-30 DIAGNOSIS — M25421 Effusion, right elbow: Secondary | ICD-10-CM | POA: Diagnosis not present

## 2023-12-30 DIAGNOSIS — M778 Other enthesopathies, not elsewhere classified: Secondary | ICD-10-CM | POA: Insufficient documentation

## 2023-12-30 DIAGNOSIS — S46211A Strain of muscle, fascia and tendon of other parts of biceps, right arm, initial encounter: Secondary | ICD-10-CM | POA: Diagnosis not present

## 2024-01-02 DIAGNOSIS — S46211A Strain of muscle, fascia and tendon of other parts of biceps, right arm, initial encounter: Secondary | ICD-10-CM | POA: Diagnosis not present

## 2024-01-02 DIAGNOSIS — M7711 Lateral epicondylitis, right elbow: Secondary | ICD-10-CM | POA: Diagnosis not present

## 2024-01-06 DIAGNOSIS — M9903 Segmental and somatic dysfunction of lumbar region: Secondary | ICD-10-CM | POA: Diagnosis not present

## 2024-01-06 DIAGNOSIS — M5136 Other intervertebral disc degeneration, lumbar region with discogenic back pain only: Secondary | ICD-10-CM | POA: Diagnosis not present

## 2024-01-06 DIAGNOSIS — M5412 Radiculopathy, cervical region: Secondary | ICD-10-CM | POA: Diagnosis not present

## 2024-01-06 DIAGNOSIS — M9901 Segmental and somatic dysfunction of cervical region: Secondary | ICD-10-CM | POA: Diagnosis not present

## 2024-01-14 ENCOUNTER — Ambulatory Visit: Admitting: Occupational Therapy

## 2024-01-27 DIAGNOSIS — M5136 Other intervertebral disc degeneration, lumbar region with discogenic back pain only: Secondary | ICD-10-CM | POA: Diagnosis not present

## 2024-01-27 DIAGNOSIS — M9903 Segmental and somatic dysfunction of lumbar region: Secondary | ICD-10-CM | POA: Diagnosis not present

## 2024-01-27 DIAGNOSIS — M9901 Segmental and somatic dysfunction of cervical region: Secondary | ICD-10-CM | POA: Diagnosis not present

## 2024-01-27 DIAGNOSIS — M5412 Radiculopathy, cervical region: Secondary | ICD-10-CM | POA: Diagnosis not present

## 2024-02-03 IMAGING — MR MR SHOULDER*L* W/O CM
4 of 5 series · 27 of 40 positions shown · non-contrast
Comparison: None Available.

CLINICAL DATA: Pain developed after lifting heavy board 3-4 months
ago, pain is progressively getting worse.

EXAM:
MRI OF THE LEFT SHOULDER WITHOUT CONTRAST
TECHNIQUE: Multiplanar, multisequence MR imaging of the shoulder was performed.
No intravenous contrast was administered.

[Series 6: T2 fat-sat · axial · left · 4.0mm · 0.27mm/px · z∈[-56,+50]mm · 9 of 23 slices shown (1 of 3)]
[im 1/23]
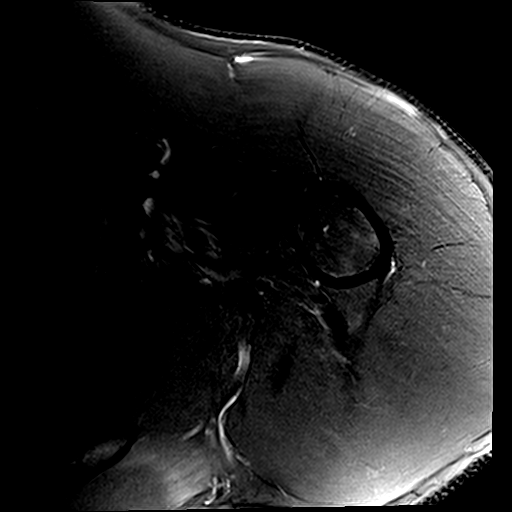
[im 3/23]
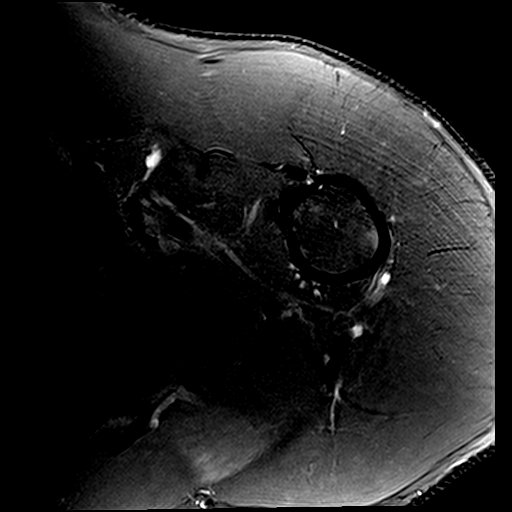
[im 6/23]
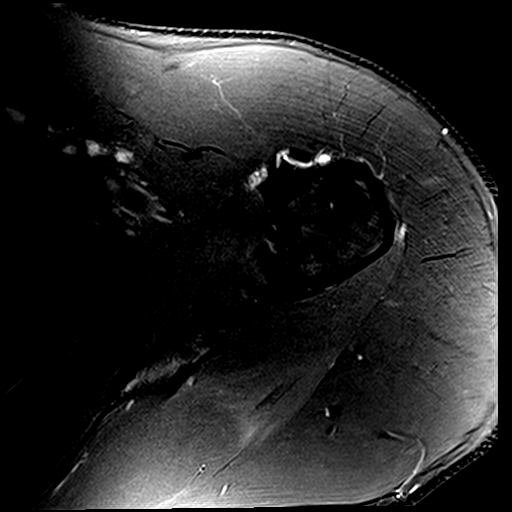
[im 9/23]
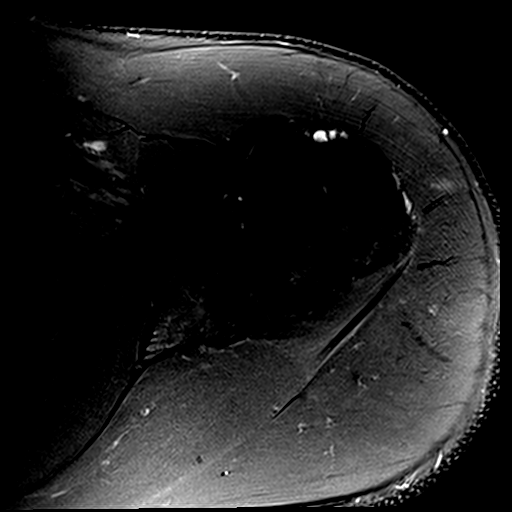
[im 12/23]
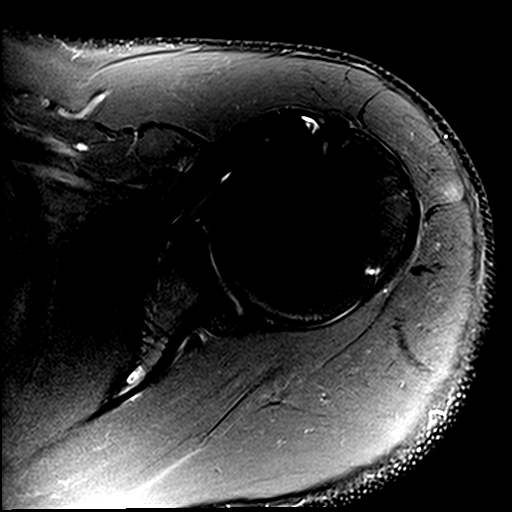
[im 14/23]
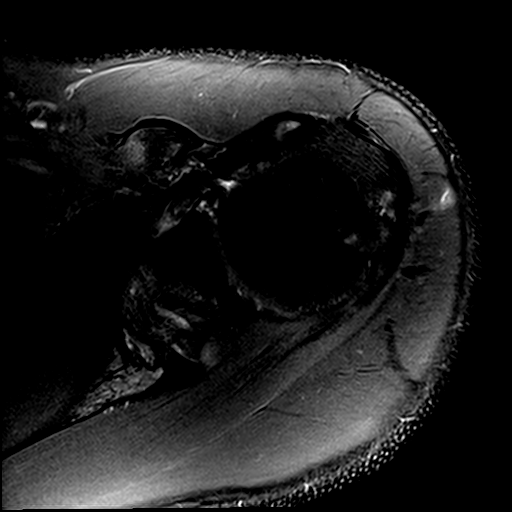
[im 17/23]
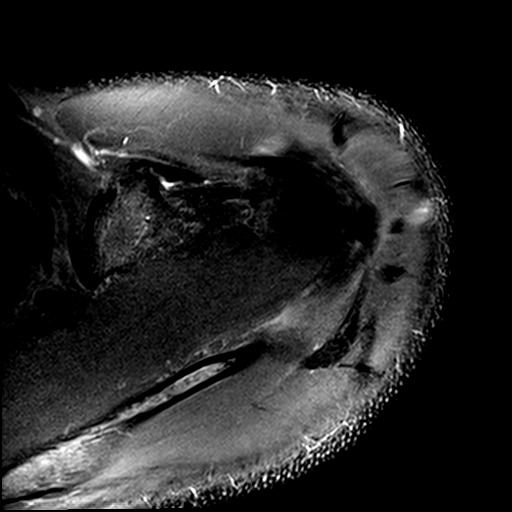
[im 20/23]
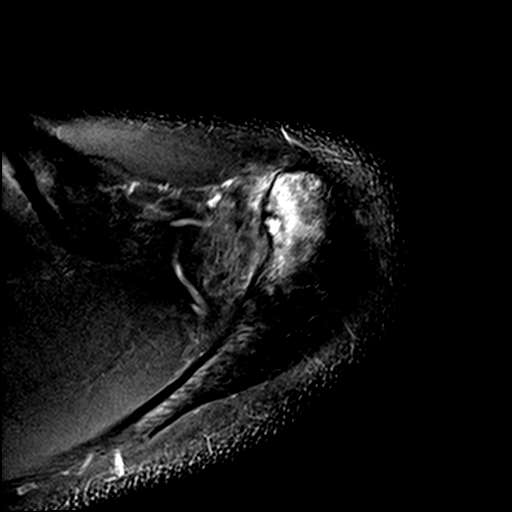
[im 23/23]
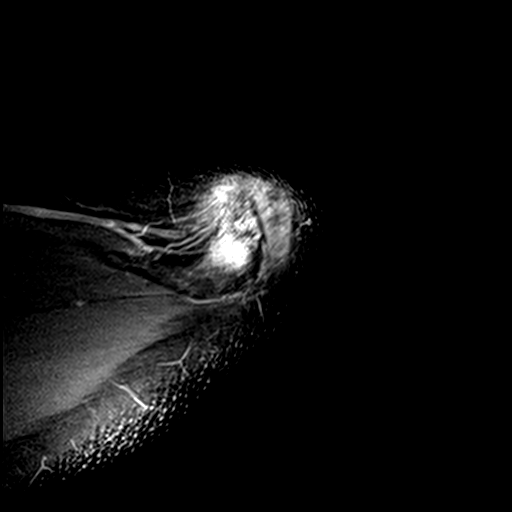

[Series 7: T2 fat-sat · oblique · left · 4.0mm · 0.27mm/px · 6 of 19 slices shown (2 of 3)]
[im 1/19]
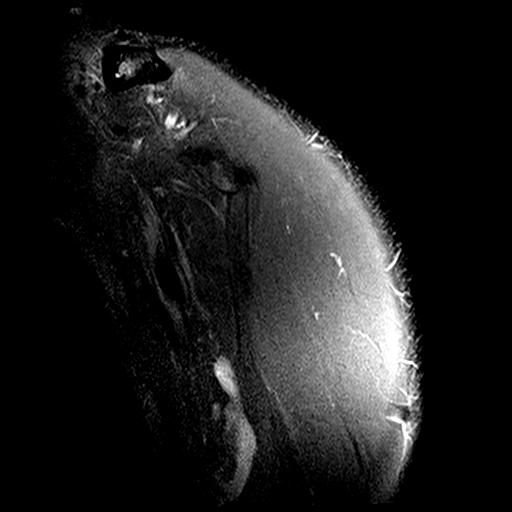
[im 4/19]
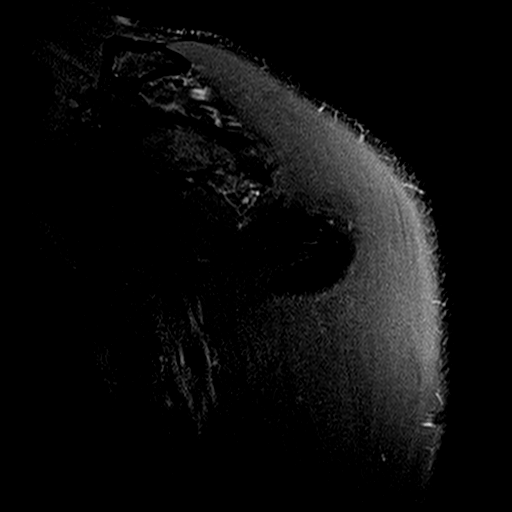
[im 7/19]
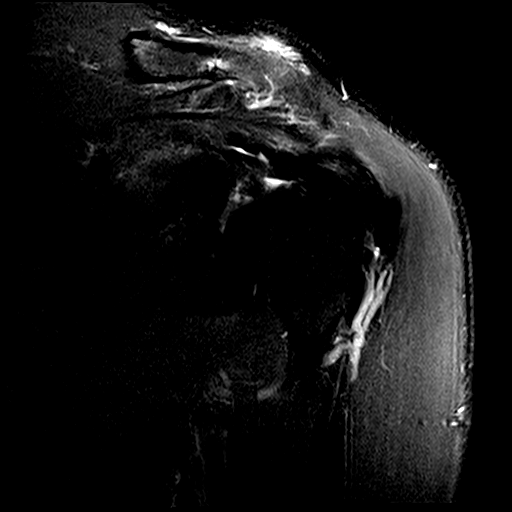
[im 10/19]
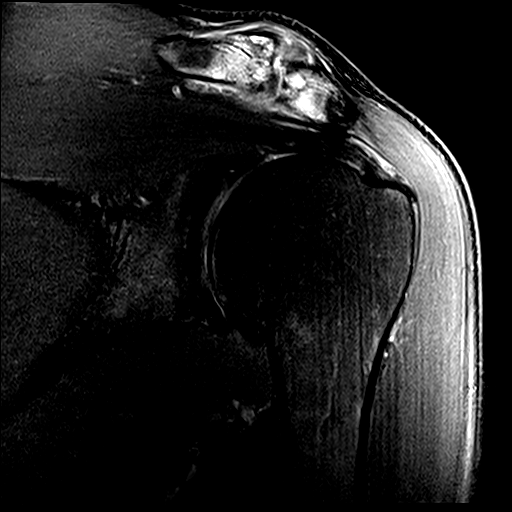
[im 13/19]
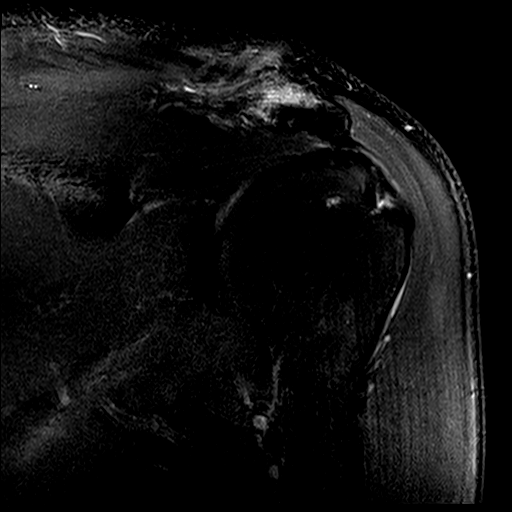
[im 16/19]
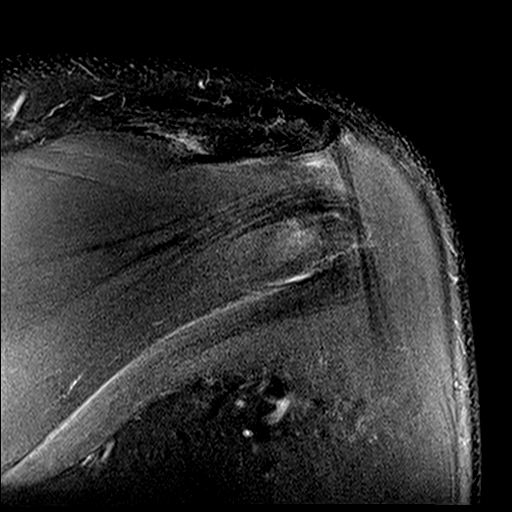

[Series 8: PD · oblique · left · 4.0mm · 0.55mm/px · 9 of 25 slices shown]
[im 1/25]
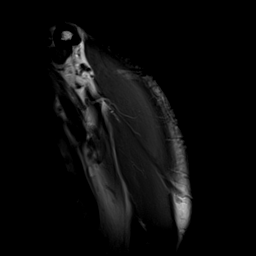
[im 4/25]
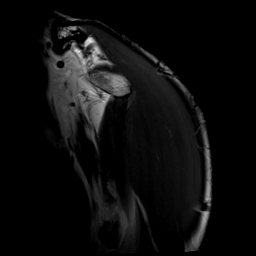
[im 7/25]
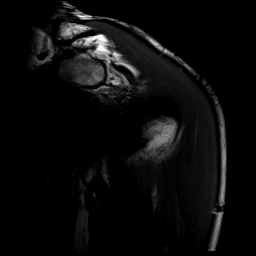
[im 10/25]
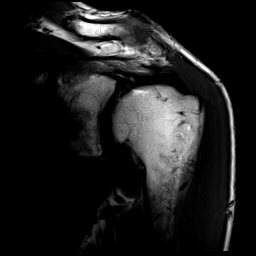
[im 13/25]
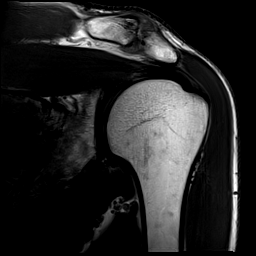
[im 16/25]
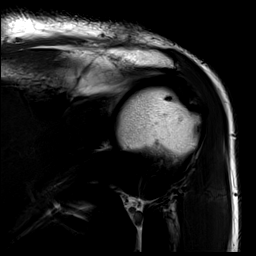
[im 19/25]
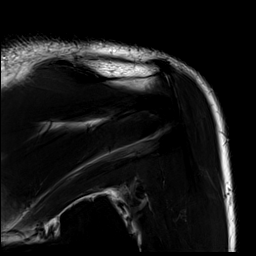
[im 22/25]
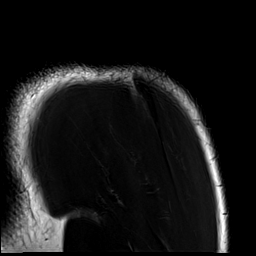
[im 25/25]
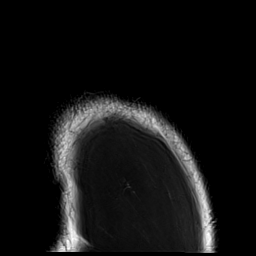

[Series 9: T2 fat-sat · sagittal · left · 4.0mm · 0.55mm/px · 3 of 23 slices shown (3 of 3)]
[im 4/23]
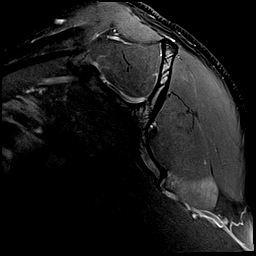
[im 13/23]
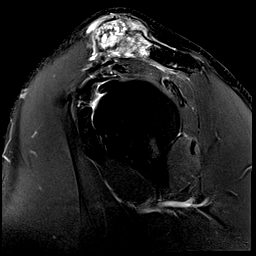
[im 19/23]
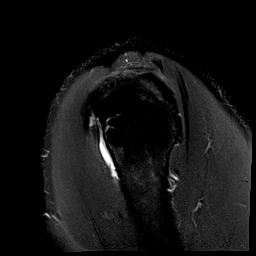

[27 of 40 positions shown; findings below may reference images not displayed]

FINDINGS: Rotator cuff: There is mild distal supraspinatus and infraspinatus
tendinosis with bursal sided fraying. Teres minor tendon is intact.
Subscapularis demonstrates mild tendinosis distally

Muscles: There is no significant muscle atrophy. There is focal
intramuscular edema within the teres minor muscle.

Biceps Long Head: Intraarticular and extraarticular portions of the
biceps tendon are intact.

Acromioclavicular Joint: Severe arthropathy of the acromioclavicular
joint, with subcortical cystic change, intense periarticular marrow
edema, capsular thickening and adjacent edema. Small amount of
subacromial/subdeltoid bursal fluid delete.

Glenohumeral Joint: No significant joint effusion. No chondral
defect.

Labrum: Irregular appearance of the anterior superior labrum,
possibly reflecting degenerative nondisplaced tearing.

Bones: No acute osseous abnormality. No suspicious lytic or blastic
lesions. Intense periarticular marrow edema at the AC joint as
described above.

Other: No fluid collection or hematoma.
IMPRESSION: Severe acromioclavicular joint osteoarthritis with intense
periarticular marrow edema, and capsular thickening. AC joint injury
could have a similar appearance.

Mild distal supraspinatus and infraspinatus tendinosis with bursal
sided fraying. Mild distal subscapularis tendinosis. No high-grade
or retracted cuff tear. No significant muscle atrophy.

Nondisplaced degenerative anterior superior labral tearing.

Isolated focal intramuscular edema within the teres minor muscle,
can be seen in low-grade muscle strain or early denervation.

## 2024-02-10 DIAGNOSIS — M9901 Segmental and somatic dysfunction of cervical region: Secondary | ICD-10-CM | POA: Diagnosis not present

## 2024-02-10 DIAGNOSIS — M9903 Segmental and somatic dysfunction of lumbar region: Secondary | ICD-10-CM | POA: Diagnosis not present

## 2024-02-10 DIAGNOSIS — M5136 Other intervertebral disc degeneration, lumbar region with discogenic back pain only: Secondary | ICD-10-CM | POA: Diagnosis not present

## 2024-02-10 DIAGNOSIS — M5412 Radiculopathy, cervical region: Secondary | ICD-10-CM | POA: Diagnosis not present

## 2024-03-24 NOTE — Progress Notes (Signed)
 Paul Jacobs                                          MRN: [id number]   03/24/2024   The [facility] Quality Team Specialist reviewed this patient medical record for the purposes of chart review for care gap closure. The following were reviewed: abstraction for care gap closure-diabetic eye exam.    [facility] Quality Team

## 2024-03-24 NOTE — Progress Notes (Signed)
 Paul Jacobs                                          MRN: [id number]   03/24/2024   The [facility] Quality Team Specialist reviewed this patient medical record for the purposes of chart review for care gap closure. The following were reviewed: abstraction for care gap closure-glycemic status assessment.    [facility] Quality Team
